# Patient Record
Sex: Female | Born: 1935 | Race: Black or African American | Hispanic: No | Smoking: Former smoker
Health system: Southern US, Community
[De-identification: ages and names within clinical notes are randomized; demographics above are authoritative.]

## PROBLEM LIST (undated history)

## (undated) DIAGNOSIS — B379 Candidiasis, unspecified: Secondary | ICD-10-CM

## (undated) DIAGNOSIS — Z8742 Personal history of other diseases of the female genital tract: Secondary | ICD-10-CM

## (undated) DIAGNOSIS — I10 Essential (primary) hypertension: Secondary | ICD-10-CM

## (undated) DIAGNOSIS — J439 Emphysema, unspecified: Secondary | ICD-10-CM

## (undated) DIAGNOSIS — IMO0002 Reserved for concepts with insufficient information to code with codable children: Secondary | ICD-10-CM

## (undated) DIAGNOSIS — Z8619 Personal history of other infectious and parasitic diseases: Secondary | ICD-10-CM

## (undated) DIAGNOSIS — K922 Gastrointestinal hemorrhage, unspecified: Secondary | ICD-10-CM

## (undated) DIAGNOSIS — E041 Nontoxic single thyroid nodule: Secondary | ICD-10-CM

## (undated) DIAGNOSIS — N8189 Other female genital prolapse: Secondary | ICD-10-CM

## (undated) DIAGNOSIS — Z6791 Unspecified blood type, Rh negative: Secondary | ICD-10-CM

## (undated) DIAGNOSIS — Z8709 Personal history of other diseases of the respiratory system: Secondary | ICD-10-CM

## (undated) HISTORY — DX: Candidiasis, unspecified: B37.9

## (undated) HISTORY — DX: Personal history of other diseases of the respiratory system: Z87.09

## (undated) HISTORY — DX: Unspecified blood type, rh negative: Z67.91

## (undated) HISTORY — DX: Essential (primary) hypertension: I10

## (undated) HISTORY — DX: Personal history of other infectious and parasitic diseases: Z86.19

## (undated) HISTORY — DX: Reserved for concepts with insufficient information to code with codable children: IMO0002

## (undated) HISTORY — DX: Other female genital prolapse: N81.89

## (undated) HISTORY — DX: Personal history of other diseases of the female genital tract: Z87.42

## (undated) HISTORY — DX: Emphysema, unspecified: J43.9

---

## 1997-08-02 ENCOUNTER — Other Ambulatory Visit: Admission: RE | Admit: 1997-08-02 | Discharge: 1997-08-02 | Payer: Self-pay | Admitting: Family Medicine

## 1997-08-06 ENCOUNTER — Other Ambulatory Visit: Admission: RE | Admit: 1997-08-06 | Discharge: 1997-08-06 | Payer: Self-pay | Admitting: Family Medicine

## 2002-03-07 ENCOUNTER — Ambulatory Visit (HOSPITAL_COMMUNITY): Admission: RE | Admit: 2002-03-07 | Discharge: 2002-03-07 | Payer: Self-pay | Admitting: Family Medicine

## 2004-08-31 ENCOUNTER — Encounter: Admission: RE | Admit: 2004-08-31 | Discharge: 2004-08-31 | Payer: Self-pay | Admitting: Internal Medicine

## 2005-02-05 ENCOUNTER — Emergency Department (HOSPITAL_COMMUNITY): Admission: EM | Admit: 2005-02-05 | Discharge: 2005-02-05 | Payer: Self-pay | Admitting: Emergency Medicine

## 2005-03-03 ENCOUNTER — Ambulatory Visit (HOSPITAL_COMMUNITY): Admission: RE | Admit: 2005-03-03 | Discharge: 2005-03-03 | Payer: Self-pay | Admitting: Family Medicine

## 2005-05-12 ENCOUNTER — Ambulatory Visit (HOSPITAL_COMMUNITY): Admission: RE | Admit: 2005-05-12 | Discharge: 2005-05-12 | Payer: Self-pay | Admitting: Gastroenterology

## 2005-05-12 ENCOUNTER — Encounter (INDEPENDENT_AMBULATORY_CARE_PROVIDER_SITE_OTHER): Payer: Self-pay | Admitting: Specialist

## 2009-01-23 DIAGNOSIS — I10 Essential (primary) hypertension: Secondary | ICD-10-CM

## 2009-01-23 DIAGNOSIS — Z8619 Personal history of other infectious and parasitic diseases: Secondary | ICD-10-CM

## 2009-01-23 DIAGNOSIS — N8189 Other female genital prolapse: Secondary | ICD-10-CM

## 2009-01-23 DIAGNOSIS — Z8742 Personal history of other diseases of the female genital tract: Secondary | ICD-10-CM

## 2009-01-23 DIAGNOSIS — Z6791 Unspecified blood type, Rh negative: Secondary | ICD-10-CM

## 2009-01-23 HISTORY — DX: Personal history of other infectious and parasitic diseases: Z86.19

## 2009-01-23 HISTORY — DX: Other female genital prolapse: N81.89

## 2009-01-23 HISTORY — DX: Personal history of other diseases of the female genital tract: Z87.42

## 2009-01-23 HISTORY — DX: Essential (primary) hypertension: I10

## 2009-01-23 HISTORY — DX: Unspecified blood type, rh negative: Z67.91

## 2009-02-13 DIAGNOSIS — IMO0002 Reserved for concepts with insufficient information to code with codable children: Secondary | ICD-10-CM

## 2009-02-13 HISTORY — DX: Reserved for concepts with insufficient information to code with codable children: IMO0002

## 2010-01-26 ENCOUNTER — Encounter: Admission: RE | Admit: 2010-01-26 | Discharge: 2010-01-26 | Payer: Self-pay | Admitting: Otolaryngology

## 2010-03-15 ENCOUNTER — Encounter: Payer: Self-pay | Admitting: Internal Medicine

## 2010-03-16 DIAGNOSIS — N8189 Other female genital prolapse: Secondary | ICD-10-CM | POA: Insufficient documentation

## 2010-07-10 NOTE — Op Note (Signed)
NAMESHANESE, RIEMENSCHNEIDER             ACCOUNT NO.:  [id number]   MEDICAL RECORD NO.:  [id number]          PATIENT TYPE:  AMB   LOCATION:  ENDO                         FACILITY:  [facility]   PHYSICIAN:  Anselmo Rod, M.D.  DATE OF BIRTH:  07/05/1925   DATE OF PROCEDURE:  05/12/2005  DATE OF DISCHARGE:                                 OPERATIVE REPORT   PROCEDURE PERFORMED:  Colonoscopy with cold biopsies x 6.   ENDOSCOPIST:  Anselmo Rod, M.D.   INSTRUMENT USED:  Olympus video colonoscope.   INDICATIONS FOR PROCEDURE:  A 77 year old African-American female with a  history of anemia and abnormal weight loss undergoing colonoscopy to rule  out colonic polyps, masses, etc.   PREPROCEDURE PREPARATION:  Informed consent was procured from the patient.  The patient was fasted for four hours prior to the procedure and prepped  with OsmoPrep pills the night of and the morning of the procedure.  The  risks and benefits of the procedure including a 10% miss rate for cancer or  polyps was discussed with the patient as well.   PREPROCEDURE PHYSICAL:  The patient had stable vital signs.  Neck supple.  Chest clear to auscultation.  S1 and S2 regular.  Abdomen soft with normal  bowel sounds.   DESCRIPTION OF PROCEDURE:  The patient was placed in left lateral decubitus  position and sedated with 80 mcg of fentanyl and 7.5 mg of Versed in slow  incremental doses.  Once the patient was adequately sedated and maintained  on low flow oxygen and continuous cardiac monitoring, the Olympus video  colonoscope was advanced from the rectum to the cecum.  The appendicular  orifice and ileocecal valve were clearly visualized and photographed.  There  was evidence of severe diverticular change throughout the colon with more  prominent changes in the left colon.  There was inspissated stool in several  of the diverticula.  A prominent fold was biopsied from 30 cm.  The  appendicular orifice and ileocecal valve  were visualized and photographed.  Small lesions could be missed.  The patient tolerated the procedure well  without immediate complication.   IMPRESSION:  1.  Pandiverticulosis with extensive changes in the left colon.  2.  Prominent fold biopsied from 30 cm.  3.  Large amount of residual stool in the colon.  Small lesions could be      missed.   RECOMMENDATIONS:  1.  Await pathology results.  2.  Avoid all nonsteroidals for now.  3.  Outpatient followup in the next two weeks for further recommendations.      Anselmo Rod, M.D.  Electronically Signed     JNM/MEDQ  D:  05/12/2005  T:  05/13/2005  Job:  [id number]   cc:   Candyce Churn. Allyne Gee, M.D.  Fax: [phone]

## 2010-10-15 ENCOUNTER — Ambulatory Visit: Payer: Self-pay | Admitting: Family Medicine

## 2010-11-01 ENCOUNTER — Emergency Department (HOSPITAL_COMMUNITY)
Admission: EM | Admit: 2010-11-01 | Discharge: 2010-11-01 | Disposition: A | Discharge status: Home / Self Care | Payer: Medicare Other | Attending: Emergency Medicine | Admitting: Emergency Medicine

## 2010-11-01 DIAGNOSIS — W268XXA Contact with other sharp object(s), not elsewhere classified, initial encounter: Secondary | ICD-10-CM | POA: Insufficient documentation

## 2010-11-01 DIAGNOSIS — I10 Essential (primary) hypertension: Secondary | ICD-10-CM | POA: Insufficient documentation

## 2010-11-01 DIAGNOSIS — S61209A Unspecified open wound of unspecified finger without damage to nail, initial encounter: Secondary | ICD-10-CM | POA: Insufficient documentation

## 2011-01-20 ENCOUNTER — Emergency Department (HOSPITAL_COMMUNITY)
Admission: EM | Admit: 2011-01-20 | Discharge: 2011-01-20 | Disposition: A | Discharge status: Home / Self Care | Payer: Medicare Other | Source: Home / Self Care | Attending: Emergency Medicine | Admitting: Emergency Medicine

## 2011-05-19 ENCOUNTER — Encounter (INDEPENDENT_AMBULATORY_CARE_PROVIDER_SITE_OTHER): Payer: Medicare Other | Admitting: Obstetrics and Gynecology

## 2011-05-19 DIAGNOSIS — N816 Rectocele: Secondary | ICD-10-CM

## 2011-05-19 DIAGNOSIS — N814 Uterovaginal prolapse, unspecified: Secondary | ICD-10-CM

## 2011-05-19 DIAGNOSIS — N8111 Cystocele, midline: Secondary | ICD-10-CM

## 2011-06-02 ENCOUNTER — Encounter: Payer: Medicare Other | Admitting: Obstetrics and Gynecology

## 2011-06-18 DIAGNOSIS — N8189 Other female genital prolapse: Secondary | ICD-10-CM

## 2011-06-21 ENCOUNTER — Ambulatory Visit (INDEPENDENT_AMBULATORY_CARE_PROVIDER_SITE_OTHER): Payer: Medicare Other | Admitting: Obstetrics and Gynecology

## 2011-06-21 ENCOUNTER — Encounter: Payer: Self-pay | Admitting: Obstetrics and Gynecology

## 2011-06-21 VITALS — BP 110/64 | HR 68 | Resp 16 | Ht <= 58 in | Wt 104.0 lb

## 2011-06-21 DIAGNOSIS — N8189 Other female genital prolapse: Secondary | ICD-10-CM

## 2011-06-21 NOTE — Progress Notes (Signed)
No complaints and wants Pessary replaced.  Filed Vitals:   06/21/11 1358  BP: 110/64  Pulse: 68  Resp: 16   Pelvic exam: normal external genitalia, vulva, vagina, cervix, uterus and adnexa.   Post fornix still slightly irritated. Silver Nitrate applied.  A/P Pelvic relaxation Pessary replaced On estrace and provera RTO in 2wks

## 2011-06-22 ENCOUNTER — Telehealth: Payer: Self-pay

## 2011-06-22 MED ORDER — MEDROXYPROGESTERONE ACETATE 10 MG PO TABS: 10.0000 mg | ORAL_TABLET | Freq: Every day | ORAL | Status: DC

## 2011-06-22 NOTE — Progress Notes (Signed)
Addended by: Osborn Coho on: 06/22/2011 03:24 PM   Modules accepted: Orders

## 2011-07-08 ENCOUNTER — Encounter: Payer: Self-pay | Admitting: Obstetrics and Gynecology

## 2011-07-08 ENCOUNTER — Ambulatory Visit (INDEPENDENT_AMBULATORY_CARE_PROVIDER_SITE_OTHER): Payer: Medicare Other | Admitting: Obstetrics and Gynecology

## 2011-07-08 VITALS — BP 106/62 | Ht <= 58 in | Wt 106.0 lb

## 2011-07-08 DIAGNOSIS — N8189 Other female genital prolapse: Secondary | ICD-10-CM

## 2011-07-08 NOTE — Progress Notes (Signed)
No complaints Filed Vitals:   07/08/11 1522  BP: 106/62   ROS: noncontributory  Pelvic exam:  VULVA: normal appearing vulva with no masses, tenderness or lesions,  VAGINA: normal appearing vagina with normal color and discharge, no lesions, CERVIX: normal appearing cervix without discharge or lesions,  UTERUS: uterus is normal size, shape, consistency and nontender,  ADNEXA: normal adnexa in size, nontender and no masses.  A/P Pessary changed without difficulty Cont vaginal estrogen and provera RTO 

## 2011-07-28 NOTE — Telephone Encounter (Signed)
Chart note to close encounter only. Shannon Riggs, [name]  

## 2011-09-15 ENCOUNTER — Encounter: Payer: Medicare Other | Admitting: Obstetrics and Gynecology

## 2011-09-28 ENCOUNTER — Ambulatory Visit (INDEPENDENT_AMBULATORY_CARE_PROVIDER_SITE_OTHER): Payer: Medicare Other | Admitting: Obstetrics and Gynecology

## 2011-09-28 ENCOUNTER — Encounter: Payer: Self-pay | Admitting: Obstetrics and Gynecology

## 2011-09-28 VITALS — BP 120/70 | Resp 14 | Wt 101.0 lb

## 2011-09-28 DIAGNOSIS — N939 Abnormal uterine and vaginal bleeding, unspecified: Secondary | ICD-10-CM

## 2011-09-28 DIAGNOSIS — N8189 Other female genital prolapse: Secondary | ICD-10-CM

## 2011-09-28 DIAGNOSIS — N898 Other specified noninflammatory disorders of vagina: Secondary | ICD-10-CM

## 2011-09-28 NOTE — Progress Notes (Signed)
C/o spotting  Filed Vitals:   09/28/11 1154  BP: 120/70  Resp: 14    ROS: noncontributory  Pelvic exam:  VULVA: normal appearing vulva with no masses, tenderness or lesions,  VAGINA: normal appearing vagina with normal color and discharge, no lesions, brown d/c in vagina, no areas of erosion CERVIX: normal appearing cervix without discharge or lesions,  UTERUS: uterus is normal size, shape, consistency and nontender,  ADNEXA: normal adnexa in size, nontender and no masses.  A/P D/c estrogen and provera Rx trimosan RTO 1 month for poss replacement of pessary and discuss with pt ordering smaller size

## 2011-10-28 ENCOUNTER — Encounter: Payer: Medicare Other | Admitting: Obstetrics and Gynecology

## 2011-11-01 ENCOUNTER — Other Ambulatory Visit: Payer: Self-pay | Admitting: Obstetrics and Gynecology

## 2011-11-23 ENCOUNTER — Other Ambulatory Visit: Payer: Self-pay | Admitting: Obstetrics and Gynecology

## 2011-11-25 ENCOUNTER — Other Ambulatory Visit: Payer: Self-pay

## 2011-11-25 ENCOUNTER — Telehealth: Payer: Self-pay | Admitting: Obstetrics and Gynecology

## 2011-11-25 DIAGNOSIS — E041 Nontoxic single thyroid nodule: Secondary | ICD-10-CM

## 2011-11-25 NOTE — Telephone Encounter (Signed)
VM from pt.  RF Rx.  Also when is appt.   Pt did not leave phone #. Possibly [phone]

## 2011-11-25 NOTE — Telephone Encounter (Signed)
Tc to pt per telephone call. Pt states,"never received rx from last office visit". Pt was suppose to have Trimosan pres. Will consult with provider. Pt agrees.

## 2011-11-26 MED ORDER — OXYQUINOLONE SULFATE 0.025 % VA GEL: VAGINAL | Status: DC

## 2011-11-26 NOTE — Telephone Encounter (Signed)
Consulted with AR, ok to fill Trimosan pv once weekly. Rx e-pres to pharm on file. Pt agrees.

## 2011-11-26 NOTE — Addendum Note (Signed)
Addended by: Lerry Liner D on: 11/26/2011 04:30 PM   Modules accepted: Orders

## 2011-12-27 ENCOUNTER — Other Ambulatory Visit: Payer: Self-pay | Admitting: Obstetrics and Gynecology

## 2012-10-05 ENCOUNTER — Encounter (HOSPITAL_COMMUNITY): Payer: Self-pay | Admitting: Emergency Medicine

## 2012-10-05 DIAGNOSIS — R5381 Other malaise: Secondary | ICD-10-CM | POA: Insufficient documentation

## 2012-10-05 DIAGNOSIS — Z8639 Personal history of other endocrine, nutritional and metabolic disease: Secondary | ICD-10-CM | POA: Insufficient documentation

## 2012-10-05 DIAGNOSIS — Z87891 Personal history of nicotine dependence: Secondary | ICD-10-CM | POA: Insufficient documentation

## 2012-10-05 DIAGNOSIS — R5383 Other fatigue: Secondary | ICD-10-CM | POA: Insufficient documentation

## 2012-10-05 DIAGNOSIS — Z8742 Personal history of other diseases of the female genital tract: Secondary | ICD-10-CM | POA: Insufficient documentation

## 2012-10-05 DIAGNOSIS — Z79899 Other long term (current) drug therapy: Secondary | ICD-10-CM | POA: Insufficient documentation

## 2012-10-05 DIAGNOSIS — Z862 Personal history of diseases of the blood and blood-forming organs and certain disorders involving the immune mechanism: Secondary | ICD-10-CM | POA: Insufficient documentation

## 2012-10-05 DIAGNOSIS — Z8619 Personal history of other infectious and parasitic diseases: Secondary | ICD-10-CM | POA: Insufficient documentation

## 2012-10-05 DIAGNOSIS — J438 Other emphysema: Secondary | ICD-10-CM | POA: Insufficient documentation

## 2012-10-05 DIAGNOSIS — I10 Essential (primary) hypertension: Secondary | ICD-10-CM | POA: Insufficient documentation

## 2012-10-05 NOTE — ED Notes (Signed)
FAMILY REPORTED THAT PT.'S BLOOD PRESSURE IS LOW THIS EVENING , SLIGHT HEADACHE , RESPIRATIONS UNLABORED . ALERT AND ORIENTED .

## 2012-10-05 NOTE — ED Notes (Signed)
NURSE FIRST ROUNDS : NURSE EXPLAINED DELAY , WAIT TIME AND PROCESS TO PT. RESPIRATIONS UNLABORED / DENIES PAIN AT THIS TIME .

## 2012-10-06 ENCOUNTER — Emergency Department (HOSPITAL_COMMUNITY): Payer: Medicare Other

## 2012-10-06 ENCOUNTER — Emergency Department (HOSPITAL_COMMUNITY)
Admission: EM | Admit: 2012-10-06 | Discharge: 2012-10-06 | Disposition: A | Discharge status: Home / Self Care | Payer: Medicare Other | Attending: Emergency Medicine | Admitting: Emergency Medicine

## 2012-10-06 DIAGNOSIS — R531 Weakness: Secondary | ICD-10-CM

## 2012-10-06 HISTORY — DX: Nontoxic single thyroid nodule: E04.1

## 2012-10-06 LAB — CBC WITH DIFFERENTIAL/PLATELET
Eosinophils Absolute: 0.6 10*3/uL (ref 0.0–0.7)
Hemoglobin: 10.9 g/dL — ABNORMAL LOW (ref 12.0–15.0)
Lymphocytes Relative: 29 % (ref 12–46)
Lymphs Abs: 2.7 10*3/uL (ref 0.7–4.0)
MCH: 26.7 pg (ref 26.0–34.0)
Monocytes Relative: 10 % (ref 3–12)
Neutrophils Relative %: 55 % (ref 43–77)
RBC: 4.08 MIL/uL (ref 3.87–5.11)
WBC: 9.5 10*3/uL (ref 4.0–10.5)

## 2012-10-06 LAB — URINE MICROSCOPIC-ADD ON

## 2012-10-06 LAB — COMPREHENSIVE METABOLIC PANEL
ALT: 10 U/L (ref 0–35)
Alkaline Phosphatase: 59 U/L (ref 39–117)
BUN: 18 mg/dL (ref 6–23)
CO2: 25 mEq/L (ref 19–32)
Chloride: 101 mEq/L (ref 96–112)
GFR calc Af Amer: 56 mL/min — ABNORMAL LOW (ref >90)
GFR calc non Af Amer: 48 mL/min — ABNORMAL LOW (ref >90)
Glucose, Bld: 101 mg/dL — ABNORMAL HIGH (ref 70–99)
Potassium: 3.6 mEq/L (ref 3.5–5.1)
Total Bilirubin: 0.3 mg/dL (ref 0.3–1.2)
Total Protein: 7.8 g/dL (ref 6.0–8.3)

## 2012-10-06 LAB — URINALYSIS, ROUTINE W REFLEX MICROSCOPIC
Bilirubin Urine: NEGATIVE
Glucose, UA: NEGATIVE mg/dL
Hgb urine dipstick: NEGATIVE
Ketones, ur: NEGATIVE mg/dL
Protein, ur: NEGATIVE mg/dL
Urobilinogen, UA: 1 mg/dL (ref 0.0–1.0)

## 2012-10-06 LAB — LIPASE, BLOOD: Lipase: 14 U/L (ref 11–59)

## 2012-10-06 MED ORDER — SODIUM CHLORIDE 0.9 % IV BOLUS (SEPSIS)
500.0000 mL | Freq: Once | INTRAVENOUS | Status: AC
  Administered 2012-10-06: 500 mL via INTRAVENOUS

## 2012-10-06 NOTE — ED Provider Notes (Signed)
CSN: [id number]     Arrival date & time 10/05/12  2158 History     First MD Initiated Contact with Patient 10/06/12 0202     Chief Complaint  Patient presents with  . Hypotension   (Consider location/radiation/quality/duration/timing/severity/associated sxs/prior Treatment) HPI Comments: Patient had her blood pressure checked by her daughters nurse and found it to be 98 systolic. She called her doctor and was referred to the ED. She checked her blood pressure because she was feeling "cold". She denies any dizziness, lightheadedness, chest pain or shortness of breath. No abdominal pain nausea or vomiting. She endorses good by mouth intake and urine output. Denies any focal weakness, numbness or tingling. She states she's been out of her blood pressure medications for the past 4 days and recently restarted them. Denies any changes with them. She states her normal blood pressure is 120 systolic. Her blood pressure is 120s to 130s and she denies complaints.  The history is provided by the patient.    Past Medical History  Diagnosis Date  . Blood type, Rh negative 01/23/09  . Yeast infection   . H/O emphysema   . Hypertension 01/23/09  . H/O measles 01/23/09  . Hx of varicella 01/23/09  . H/O uterine prolapse 01/23/09  . Pelvic relaxation 01/23/09  . Cystocele 02/13/09  . Thyroid cyst    History reviewed. No pertinent past surgical history. No family history on file. History  Substance Use Topics  . Smoking status: Former Smoker -- 0.00 packs/day    Types: Cigarettes    Quit date: 06/21/1978  . Smokeless tobacco: Not on file  . Alcohol Use: No   OB History   Grav Para Term Preterm Abortions TAB SAB Ect Mult Living                 Review of Systems  Constitutional: Negative for fever, activity change and appetite change.  HENT: Negative for congestion and rhinorrhea.   Respiratory: Negative for chest tightness and wheezing.   Cardiovascular: Negative for chest pain.   Gastrointestinal: Negative for nausea, vomiting and abdominal pain.  Genitourinary: Negative for dysuria and hematuria.  Musculoskeletal: Negative for back pain.  Neurological: Negative for dizziness, weakness and headaches.  A complete 10 system review of systems was obtained and all systems are negative except as noted in the HPI and PMH.    Allergies  Review of patient's allergies indicates no known allergies.  Home Medications   Current Outpatient Rx  Name  Route  Sig  Dispense  Refill  . amLODipine (NORVASC) 10 MG tablet   Oral   Take 10 mg by mouth daily.         . hydrochlorothiazide (HYDRODIURIL) 25 MG tablet   Oral   Take 25 mg by mouth daily.         Marland Kitchen HYDROcodone-acetaminophen (NORCO/VICODIN) 5-325 MG per tablet   Oral   Take 1 tablet by mouth every 6 (six) hours as needed for pain.         . mirtazapine (REMERON) 15 MG tablet   Oral   Take 15 mg by mouth at bedtime.         . naproxen (NAPROSYN) 500 MG tablet   Oral   Take 500 mg by mouth 2 (two) times daily as needed (for pain).         Marland Kitchen olmesartan-hydrochlorothiazide (BENICAR HCT) 40-25 MG per tablet   Oral   Take 1 tablet by mouth daily.         Marland Kitchen  simvastatin (ZOCOR) 10 MG tablet   Oral   Take 10 mg by mouth at bedtime.          BP 134/61  Pulse 72  Temp(Src) 99.5 F (37.5 C) (Oral)  Resp 13  SpO2 99% Physical Exam  Constitutional: She is oriented to person, place, and time. She appears well-developed and well-nourished. No distress.  HENT:  Head: Normocephalic and atraumatic.  Mouth/Throat: Oropharynx is clear and moist. No oropharyngeal exudate.  Slightly dry mucus membranes  Eyes: Conjunctivae and EOM are normal. Pupils are equal, round, and reactive to light.  Neck: Normal range of motion. Neck supple.  Cardiovascular: Normal rate, regular rhythm and normal heart sounds.   Pulmonary/Chest: Effort normal and breath sounds normal. No respiratory distress.  Abdominal: Soft.  There is no tenderness. There is no rebound and no guarding.  Musculoskeletal: Normal range of motion. She exhibits no edema and no tenderness.  Neurological: She is alert and oriented to person, place, and time. No cranial nerve deficit. She exhibits normal muscle tone. Coordination normal.  CN 2-12 intact, no ataxia on finger to nose, no nystagmus, 5/5 strength throughout, no pronator drift, Romberg negative, normal gait.   Skin: Skin is warm.    ED Course   Procedures (including critical care time)  Labs Reviewed  CBC WITH DIFFERENTIAL - Abnormal; Notable for the following:    Hemoglobin 10.9 (*)    HCT 34.0 (*)    Eosinophils Relative 7 (*)    All other components within normal limits  COMPREHENSIVE METABOLIC PANEL - Abnormal; Notable for the following:    Glucose, Bld 101 (*)    Albumin 3.2 (*)    GFR calc non Af Amer 48 (*)    GFR calc Af Amer 56 (*)    All other components within normal limits  URINALYSIS, ROUTINE W REFLEX MICROSCOPIC - Abnormal; Notable for the following:    APPearance CLOUDY (*)    Leukocytes, UA SMALL (*)    All other components within normal limits  URINE MICROSCOPIC-ADD ON - Abnormal; Notable for the following:    Casts HYALINE CASTS (*)    All other components within normal limits  URINE CULTURE  LIPASE, BLOOD  TROPONIN I   Dg Chest 2 View  10/06/2012   *RADIOLOGY REPORT*  Clinical Data: Weakness.  CHEST - 2 VIEW  Comparison: Chest x-ray 01/26/2010.  Findings: Large hiatal hernia. Lung volumes are normal.  No consolidative airspace disease.  No pleural effusions.  No pneumothorax.  No pulmonary nodule or mass noted.  Pulmonary vasculature and the cardiomediastinal silhouette are within normal limits.  Atherosclerosis in the thoracic aorta.  IMPRESSION: 1. No radiographic evidence of acute cardiopulmonary disease. 2.  Atherosclerosis. 3.  Large hiatal hernia.   Original Report Authenticated By: Trudie Reed, M.D.   1. Weakness     MDM   Transient hypotension with blood pressure checked at home. Blood pressure normal here. Asymptomatic.  Patient appears well. Her exam is unremarkable. Orthostatics are negative. Given her age, labs and x-ray will be obtained.  Labs show mild anemia without comparison. Urinalysis is equivocal and culture will be sent. Chest x-ray negative. Patient ambulatory without assistance. Tolerating by mouth. Blood pressure has been stable throughout the entire ED course. No hypotension or tachycardia.    Date: 10/06/2012  Rate: 83  Rhythm: normal sinus rhythm  QRS Axis: normal  Intervals: normal  ST/T Wave abnormalities: normal  Conduction Disutrbances:none  Narrative Interpretation:   Old EKG Reviewed: unchanged  BP 134/61  Pulse 72  Temp(Src) 99.5 F (37.5 C) (Oral)  Resp 13  SpO2 99%   Glynn Octave, MD 10/06/12 [phone]

## 2012-10-06 NOTE — ED Notes (Signed)
Pt is unable to give urine specimen at this time. Pt will call tech, when able to give u/a specimen. The tech has reported to the RN in charge.

## 2012-10-06 NOTE — ED Notes (Signed)
Fluid challenge completed, patient tolerated well.

## 2012-10-07 LAB — URINE CULTURE

## 2012-10-24 ENCOUNTER — Encounter (HOSPITAL_COMMUNITY): Payer: Self-pay | Admitting: Emergency Medicine

## 2012-10-24 ENCOUNTER — Inpatient Hospital Stay (HOSPITAL_COMMUNITY)
Admission: EM | Admit: 2012-10-24 | Discharge: 2012-10-27 | DRG: 377 | Disposition: A | Discharge status: Home / Self Care | Payer: Medicare Other | Attending: Family Medicine | Admitting: Family Medicine

## 2012-10-24 ENCOUNTER — Emergency Department (HOSPITAL_COMMUNITY): Payer: Medicare Other

## 2012-10-24 DIAGNOSIS — K922 Gastrointestinal hemorrhage, unspecified: Principal | ICD-10-CM

## 2012-10-24 DIAGNOSIS — R55 Syncope and collapse: Secondary | ICD-10-CM

## 2012-10-24 DIAGNOSIS — I10 Essential (primary) hypertension: Secondary | ICD-10-CM

## 2012-10-24 DIAGNOSIS — I129 Hypertensive chronic kidney disease with stage 1 through stage 4 chronic kidney disease, or unspecified chronic kidney disease: Secondary | ICD-10-CM | POA: Diagnosis present

## 2012-10-24 DIAGNOSIS — K269 Duodenal ulcer, unspecified as acute or chronic, without hemorrhage or perforation: Secondary | ICD-10-CM | POA: Diagnosis present

## 2012-10-24 DIAGNOSIS — N8189 Other female genital prolapse: Secondary | ICD-10-CM

## 2012-10-24 DIAGNOSIS — E860 Dehydration: Secondary | ICD-10-CM

## 2012-10-24 DIAGNOSIS — R82998 Other abnormal findings in urine: Secondary | ICD-10-CM | POA: Diagnosis present

## 2012-10-24 DIAGNOSIS — I951 Orthostatic hypotension: Secondary | ICD-10-CM

## 2012-10-24 DIAGNOSIS — K449 Diaphragmatic hernia without obstruction or gangrene: Secondary | ICD-10-CM | POA: Diagnosis present

## 2012-10-24 DIAGNOSIS — N179 Acute kidney failure, unspecified: Secondary | ICD-10-CM

## 2012-10-24 DIAGNOSIS — N183 Chronic kidney disease, stage 3 unspecified: Secondary | ICD-10-CM

## 2012-10-24 DIAGNOSIS — R8290 Unspecified abnormal findings in urine: Secondary | ICD-10-CM

## 2012-10-24 DIAGNOSIS — I959 Hypotension, unspecified: Secondary | ICD-10-CM

## 2012-10-24 DIAGNOSIS — Z79899 Other long term (current) drug therapy: Secondary | ICD-10-CM

## 2012-10-24 DIAGNOSIS — E43 Unspecified severe protein-calorie malnutrition: Secondary | ICD-10-CM

## 2012-10-24 DIAGNOSIS — IMO0002 Reserved for concepts with insufficient information to code with codable children: Secondary | ICD-10-CM

## 2012-10-24 DIAGNOSIS — D62 Acute posthemorrhagic anemia: Secondary | ICD-10-CM | POA: Diagnosis present

## 2012-10-24 DIAGNOSIS — Z6791 Unspecified blood type, Rh negative: Secondary | ICD-10-CM

## 2012-10-24 LAB — COMPREHENSIVE METABOLIC PANEL
ALT: 13 U/L (ref 0–35)
AST: 20 U/L (ref 0–37)
Albumin: 2.2 g/dL — ABNORMAL LOW (ref 3.5–5.2)
Alkaline Phosphatase: 46 U/L (ref 39–117)
CO2: 21 mEq/L (ref 19–32)
Chloride: 108 mEq/L (ref 96–112)
GFR calc non Af Amer: 32 mL/min — ABNORMAL LOW (ref >90)
Potassium: 4.3 mEq/L (ref 3.5–5.1)
Sodium: 141 mEq/L (ref 135–145)
Total Bilirubin: 0.2 mg/dL — ABNORMAL LOW (ref 0.3–1.2)

## 2012-10-24 LAB — POCT I-STAT TROPONIN I: Troponin i, poc: 0.01 ng/mL (ref 0.00–0.08)

## 2012-10-24 LAB — OCCULT BLOOD, POC DEVICE: Fecal Occult Bld: POSITIVE — AB

## 2012-10-24 LAB — CBC WITH DIFFERENTIAL/PLATELET
Basophils Absolute: 0 10*3/uL (ref 0.0–0.1)
Basophils Relative: 0 % (ref 0–1)
HCT: 25.7 % — ABNORMAL LOW (ref 36.0–46.0)
Lymphocytes Relative: 19 % (ref 12–46)
MCHC: 32.3 g/dL (ref 30.0–36.0)
Monocytes Absolute: 1.2 10*3/uL — ABNORMAL HIGH (ref 0.1–1.0)
Neutro Abs: 4.9 10*3/uL (ref 1.7–7.7)
Neutrophils Relative %: 60 % (ref 43–77)
Platelets: 266 10*3/uL (ref 150–400)
RDW: 14.7 % (ref 11.5–15.5)
WBC: 8.1 10*3/uL (ref 4.0–10.5)

## 2012-10-24 LAB — PREPARE RBC (CROSSMATCH)

## 2012-10-24 LAB — CARBOXYHEMOGLOBIN: O2 Saturation: 22.2 %

## 2012-10-24 MED ORDER — SODIUM CHLORIDE 0.9 % IV SOLN
80.0000 mg | INTRAVENOUS | Status: AC
  Administered 2012-10-24: 20:00:00 80 mg via INTRAVENOUS
  Filled 2012-10-24: qty 80

## 2012-10-24 MED ORDER — SODIUM CHLORIDE 0.9 % IV SOLN
INTRAVENOUS | Status: DC
  Administered 2012-10-24 – 2012-10-26 (×2): via INTRAVENOUS

## 2012-10-24 MED ORDER — SODIUM CHLORIDE 0.9 % IV BOLUS (SEPSIS)
1000.0000 mL | Freq: Once | INTRAVENOUS | Status: AC
  Administered 2012-10-24: 1000 mL via INTRAVENOUS

## 2012-10-24 MED ORDER — ONDANSETRON HCL 4 MG/2ML IJ SOLN: 4.0000 mg | Freq: Three times a day (TID) | INTRAMUSCULAR | Status: AC | PRN

## 2012-10-24 MED ORDER — HYDROCODONE-ACETAMINOPHEN 5-325 MG PO TABS
1.0000 | ORAL_TABLET | Freq: Four times a day (QID) | ORAL | Status: DC | PRN
  Administered 2012-10-26: 1 via ORAL
  Filled 2012-10-24: qty 1

## 2012-10-24 MED ORDER — SODIUM CHLORIDE 0.9 % IV SOLN
80.0000 mg | Freq: Two times a day (BID) | INTRAVENOUS | Status: DC
  Administered 2012-10-25 – 2012-10-26 (×4): 80 mg via INTRAVENOUS
  Filled 2012-10-24 (×9): qty 80

## 2012-10-24 MED ORDER — SODIUM CHLORIDE 0.9 % IV SOLN: INTRAVENOUS | Status: DC

## 2012-10-24 MED ORDER — SODIUM CHLORIDE 0.9 % IV BOLUS (SEPSIS)
500.0000 mL | Freq: Once | INTRAVENOUS | Status: AC
  Administered 2012-10-24: 500 mL via INTRAVENOUS

## 2012-10-24 MED ORDER — SODIUM CHLORIDE 0.9 % IJ SOLN
3.0000 mL | Freq: Two times a day (BID) | INTRAMUSCULAR | Status: DC
  Administered 2012-10-25: 3 mL via INTRAVENOUS

## 2012-10-24 MED ORDER — SIMVASTATIN 10 MG PO TABS
10.0000 mg | ORAL_TABLET | Freq: Every day | ORAL | Status: DC
  Administered 2012-10-25 – 2012-10-26 (×2): 10 mg via ORAL
  Filled 2012-10-24 (×4): qty 1

## 2012-10-24 NOTE — Consult Note (Signed)
Reason for Consult: Heme positive stool and anemia Referring Physician: [facility]  Broadus John HPI: This is an 79 year old female with multiple medical problems admitted for syncope.  During her work up she was noted to be hypotensive with an SBP in the 60's range.  With IV fluids her blood pressure increased to the 120 range.  She was previously in the ER on 8/15 with similar complaints of feeling weak, but the work up was negative.  During the interval time period her HGB dropped from the 10 to the 8 range.  She had a colonoscopy by Dr. Loreta Ave on 05/12/2005 with findings of diverticula.  She does complain of epigastric pain and she has noticed some black formed stool within the past several weeks.  Past Medical History  Diagnosis Date  . Blood type, Rh negative 01/23/09  . Yeast infection   . H/O emphysema   . Hypertension 01/23/09  . H/O measles 01/23/09  . Hx of varicella 01/23/09  . H/O uterine prolapse 01/23/09  . Pelvic relaxation 01/23/09  . Cystocele 02/13/09  . Thyroid cyst     History reviewed. No pertinent past surgical history.  No family history on file.  Social History:  reports that she quit smoking about 34 years ago. Her smoking use included Cigarettes. She smoked 0.00 packs per day. She does not have any smokeless tobacco history on file. She reports that she does not drink alcohol or use illicit drugs.  Allergies: No Known Allergies  Medications:  Scheduled: . sodium chloride   Intravenous STAT  . [START ON 10/25/2012] pantoprazole (PROTONIX) IV  80 mg Intravenous Q12H  . [START ON 10/25/2012] simvastatin  10 mg Oral QHS  . sodium chloride  3 mL Intravenous Q12H   Continuous: . sodium chloride 100 mL/hr at 10/24/12 2112    Results for orders placed during the hospital encounter of 10/24/12 (from the past 24 hour(s))  CBC WITH DIFFERENTIAL     Status: Abnormal   Collection Time    10/24/12  5:45 PM      Result Value Range   WBC 8.1  4.0 - 10.5 K/uL   RBC 3.13 (*) 3.87 - 5.11 MIL/uL   Hemoglobin 8.3 (*) 12.0 - 15.0 g/dL   HCT 40.9 (*) 81.1 - 91.4 %   MCV 82.1  78.0 - 100.0 fL   MCH 26.5  26.0 - 34.0 pg   MCHC 32.3  30.0 - 36.0 g/dL   RDW 78.2  95.6 - 21.3 %   Platelets 266  150 - 400 K/uL   Neutrophils Relative % 60  43 - 77 %   Neutro Abs 4.9  1.7 - 7.7 K/uL   Lymphocytes Relative 19  12 - 46 %   Lymphs Abs 1.6  0.7 - 4.0 K/uL   Monocytes Relative 15 (*) 3 - 12 %   Monocytes Absolute 1.2 (*) 0.1 - 1.0 K/uL   Eosinophils Relative 6 (*) 0 - 5 %   Eosinophils Absolute 0.5  0.0 - 0.7 K/uL   Basophils Relative 0  0 - 1 %   Basophils Absolute 0.0  0.0 - 0.1 K/uL  COMPREHENSIVE METABOLIC PANEL     Status: Abnormal   Collection Time    10/24/12  5:45 PM      Result Value Range   Sodium 141  135 - 145 mEq/L   Potassium 4.3  3.5 - 5.1 mEq/L   Chloride 108  96 - 112 mEq/L  CO2 21  19 - 32 mEq/L   Glucose, Bld 90  70 - 99 mg/dL   BUN 31 (*) 6 - 23 mg/dL   Creatinine, Ser 8.11 (*) 0.50 - 1.10 mg/dL   Calcium 8.9  8.4 - 91.4 mg/dL   Total Protein 6.0  6.0 - 8.3 g/dL   Albumin 2.2 (*) 3.5 - 5.2 g/dL   AST 20  0 - 37 U/L   ALT 13  0 - 35 U/L   Alkaline Phosphatase 46  39 - 117 U/L   Total Bilirubin 0.2 (*) 0.3 - 1.2 mg/dL   GFR calc non Af Amer 32 (*) >90 mL/min   GFR calc Af Amer 37 (*) >90 mL/min  CARBOXYHEMOGLOBIN     Status: Abnormal   Collection Time    10/24/12  6:00 PM      Result Value Range   Total hemoglobin 8.4 (*) 12.0 - 16.0 g/dL   O2 Saturation 78.2     Carboxyhemoglobin 0.9  0.5 - 1.5 %   Methemoglobin 0.7  0.0 - 1.5 %  OCCULT BLOOD, POC DEVICE     Status: Abnormal   Collection Time    10/24/12  6:10 PM      Result Value Range   Fecal Occult Bld POSITIVE (*) NEGATIVE  POCT I-STAT TROPONIN I     Status: None   Collection Time    10/24/12  6:25 PM      Result Value Range   Troponin i, poc 0.01  0.00 - 0.08 ng/mL   Comment 3              Dg Chest 2 View  10/24/2012   *RADIOLOGY REPORT*  Clinical Data: Loss  of consciousness.  CHEST - 2 VIEW  Comparison: 10/06/2012  Findings: COPD with hyperinflation of the lungs.  Negative for heart failure or pneumonia.  Large hiatal hernia containing air fluid level.  IMPRESSION: COPD.  No acute cardiopulmonary abnormality.  Large hiatal hernia with air-fluid level.   Original Report Authenticated By: Janeece Riggers, M.D.    ROS:  As stated above in the HPI otherwise negative.  Blood pressure 125/60, pulse 77, temperature 97.8 F (36.6 C), temperature source Oral, resp. rate 20, SpO2 100.00%.    PE: Gen: NAD, Alert and Oriented HEENT:  [place]/AT, EOMI Neck: Supple, no LAD Lungs: CTA Bilaterally CV: RRR without M/G/R ABM: Soft, epigastric tenderness, +BS Ext: No C/C/E  Assessment/Plan: 1) Anemia. 2) Heme positive stool. 3) Epigastric abdominal pain.  Plan: 1) EGD tomorrow.  Gerad Cornelio D 10/24/2012, 9:44 PM

## 2012-10-24 NOTE — ED Notes (Signed)
Per pt's grandaughter pt had just gotten back from a funeral and was sitting outside on the front porch, she stepped away and when she came back pt was unresponsive. Pt did not fall she was in a chair when episode happened. Unsure of the amt of time that pt was unresponsive. EMS reports pt has been alert and oriented en route, denies any pain but does state she has had some increased weakness. Pt was hypotensive en route, 70's systolic was given 400 ml NS and pressure came up to 91/71. Pt was admitted here about 2wks ago for hypotension. 20g LFA.

## 2012-10-24 NOTE — ED Provider Notes (Signed)
CSN: [id number]     Arrival date & time 10/24/12  1711 History   First MD Initiated Contact with Patient 10/24/12 1714     Chief Complaint  Patient presents with  . Loss of Consciousness   (Consider location/radiation/quality/duration/timing/severity/associated sxs/prior Treatment) HPI Comments: 79 yo female with htn hx and hx of hypotension episode presents after syncope.  The family had funeral for her daughter today and while sitting she slumped forward and for seconds was unresponsive.  No shaking or focal sxs.  No recent bleeding.  Pt feels general weakness with decr appetite for 1 wk and epig discomfort, non radiating, mild.  No vomiting. Mild nausea.  Nothing improves. No head injury.    Patient is a 79 y.o. female presenting with syncope. The history is provided by the patient and a relative.  Loss of Consciousness Episode history:  Single Associated symptoms: no chest pain, no fever, no headaches, no shortness of breath and no vomiting     Past Medical History  Diagnosis Date  . Blood type, Rh negative 01/23/09  . Yeast infection   . H/O emphysema   . Hypertension 01/23/09  . H/O measles 01/23/09  . Hx of varicella 01/23/09  . H/O uterine prolapse 01/23/09  . Pelvic relaxation 01/23/09  . Cystocele 02/13/09  . Thyroid cyst    History reviewed. No pertinent past surgical history. No family history on file. History  Substance Use Topics  . Smoking status: Former Smoker -- 0.00 packs/day    Types: Cigarettes    Quit date: 06/21/1978  . Smokeless tobacco: Not on file  . Alcohol Use: No   OB History   Grav Para Term Preterm Abortions TAB SAB Ect Mult Living                 Review of Systems  Constitutional: Positive for fatigue. Negative for fever and chills.  HENT: Negative for neck pain and neck stiffness.   Eyes: Negative for visual disturbance.  Respiratory: Negative for shortness of breath.   Cardiovascular: Positive for syncope. Negative for chest pain.   Gastrointestinal: Positive for abdominal pain. Negative for vomiting.  Genitourinary: Positive for dysuria. Negative for flank pain.  Musculoskeletal: Negative for back pain.  Skin: Negative for rash.  Neurological: Positive for syncope and light-headedness. Negative for headaches.    Allergies  Review of patient's allergies indicates no known allergies.  Home Medications   Current Outpatient Rx  Name  Route  Sig  Dispense  Refill  . amLODipine (NORVASC) 10 MG tablet   Oral   Take 10 mg by mouth daily.         . hydrochlorothiazide (HYDRODIURIL) 25 MG tablet   Oral   Take 25 mg by mouth daily.         Marland Kitchen HYDROcodone-acetaminophen (NORCO/VICODIN) 5-325 MG per tablet   Oral   Take 1 tablet by mouth every 6 (six) hours as needed for pain. For pain         . mirtazapine (REMERON) 15 MG tablet   Oral   Take 15 mg by mouth at bedtime.         . simvastatin (ZOCOR) 10 MG tablet   Oral   Take 10 mg by mouth at bedtime.          BP 61/41  Pulse 71  Temp(Src) 97.5 F (36.4 C) (Oral)  Resp 20  SpO2 97% Physical Exam  Nursing note and vitals reviewed. Constitutional: She is oriented to person, place, and  time. She appears well-developed. No distress.  HENT:  Head: Normocephalic and atraumatic.  Dry mm  Eyes: Conjunctivae are normal. Right eye exhibits no discharge. Left eye exhibits no discharge.  Neck: Normal range of motion. Neck supple. No tracheal deviation present.  Cardiovascular: Normal rate and regular rhythm.   No murmur heard. Pulmonary/Chest: Effort normal and breath sounds normal.  Abdominal: Soft. She exhibits no distension. There is tenderness (mild epig). There is no guarding.  Genitourinary: Guaiac positive stool.  Musculoskeletal: She exhibits no edema.  Neurological: She is alert and oriented to person, place, and time.  Skin: Skin is warm. No rash noted. There is pallor.  Psychiatric: She has a normal mood and affect.    ED Course   Procedures (including critical care time)   EMERGENCY DEPARTMENT [place] CARDIAC EXAM "Study: Limited Ultrasound of the heart and pericardium"  INDICATIONS:Hypotension Multiple views of the heart and pericardium were obtained in real-time with a multi-frequency probe.  PERFORMED ZO:XWRUEA  IMAGES ARCHIVED?: Yes  FINDINGS: No pericardial effusion, Normal contractility and IVC normal  LIMITATIONS:  none  VIEWS USED: Parasternal long axis, Parasternal short axis, Apical 4 chamber  and Inferior Vena Cava  INTERPRETATION: Cardiac activity present, Pericardial effusioin absent, Cardiac tamponade absent, Probable low CVP and Normal contractility  EMERGENCY DEPARTMENT ULTRASOUND  Study: Limited Retroperitoneal Ultrasound of the Abdominal Aorta.  INDICATIONS:Hypotension, Abdominal pain and Age>55 Multiple views of the abdominal aorta were obtained in real-time from the diaphragmatic hiatus to the aortic bifurcation in transverse planes with a multi-frequency probe. PERFORMED BY: Myself IMAGES ARCHIVED?: Yes FINDINGS: Maximum aortic dimensions are 1.4 cm LIMITATIONS:  Bowel gas INTERPRETATION:  No abdominal aortic aneurysm  CRITICAL CARE Performed by: Enid Skeens   Total critical care time: 35 min  Critical care time was exclusive of separately billable procedures and treating other patients.  Critical care was necessary to treat or prevent imminent or life-threatening deterioration.  Critical care was time spent personally by me on the following activities: development of treatment plan with patient and/or surrogate as well as nursing, discussions with consultants, evaluation of patient's response to treatment, examination of patient, obtaining history from patient or surrogate, ordering and performing treatments and interventions, ordering and review of laboratory studies, ordering and review of radiographic studies, pulse oximetry and re-evaluation of patient's condition.  Labs  Review Labs Reviewed  CBC WITH DIFFERENTIAL - Abnormal; Notable for the following:    RBC 3.13 (*)    Hemoglobin 8.3 (*)    HCT 25.7 (*)    Monocytes Relative 15 (*)    Monocytes Absolute 1.2 (*)    Eosinophils Relative 6 (*)    All other components within normal limits  COMPREHENSIVE METABOLIC PANEL - Abnormal; Notable for the following:    BUN 31 (*)    Creatinine, Ser 1.46 (*)    Albumin 2.2 (*)    Total Bilirubin 0.2 (*)    GFR calc non Af Amer 32 (*)    GFR calc Af Amer 37 (*)    All other components within normal limits  CARBOXYHEMOGLOBIN - Abnormal; Notable for the following:    Total hemoglobin 8.4 (*)    All other components within normal limits  OCCULT BLOOD, POC DEVICE - Abnormal; Notable for the following:    Fecal Occult Bld POSITIVE (*)    All other components within normal limits  URINALYSIS, ROUTINE W REFLEX MICROSCOPIC  OCCULT BLOOD X 1 CARD TO LAB, STOOL  POCT I-STAT TROPONIN I  TYPE  AND SCREEN   Imaging Review No results found.  MDM  No diagnosis found. Syncope with hypotension 60 systolic.   With recent abd pain [place] brought in to rule out AAA, good EF on cardiac [place], IVC collapsed 50% with resp.  Hemocult sent.  Fluid bolus and IVs. Concern for dehydration/ metabolic vs sepsis vs other.  No free fluid in abdomen.  Paged hospitalist and GI One unit PRBC ordered. Repeat H and H. Recheck, pt improved, bp improved.  PPI given.  2 fluid boluses.  Type and screen . Pt and bp improved on recheck.  Spoke with GI and hospitalist, step down admission.  Hemocult pos, acute blood loss anemia, dehydration, ARF, GI bleed  Enid Skeens, MD 10/24/12 2041

## 2012-10-24 NOTE — ED Notes (Signed)
Pt unable to void at this time. 

## 2012-10-24 NOTE — H&P (Signed)
[facility] History and Physical  Shannon Riggs XBM:[id number] DOB: [birth year] DOA: 10/24/2012  Referring physician: ED PCP: Gwynneth Aliment, MD   Chief Complaint: Syncope, GI bleed  HPI: Shannon Riggs is a 79 y.o. female who presents to the ED after an episode of syncope occuring earlier this afternoon.  She had just been to her daughter's funeral but even prior to this has been feeling weak for a number of weeks.  She was evaluated in the ED on 10/06/12 for her weakness most recently.  Because of the syncopal episode the patient presented to the ED.  In the ED she was initially hypotensive with BP 61/41, this improved after IVF bolus, work up was positive for anemia with HGB of 8.3 (10.9 on 8/15), and guiac positive stool.  GI was consulted and hospitalist asked to admit for GI bleed.  Review of Systems: No vomiting, does have nausea, decreased appetitie for 1 week, no BRBPR, 12 systems reviewed and otherwise negative.  Past Medical History  Diagnosis Date  . Blood type, Rh negative 01/23/09  . Yeast infection   . H/O emphysema   . Hypertension 01/23/09  . H/O measles 01/23/09  . Hx of varicella 01/23/09  . H/O uterine prolapse 01/23/09  . Pelvic relaxation 01/23/09  . Cystocele 02/13/09  . Thyroid cyst    History reviewed. No pertinent past surgical history. Social History:  reports that she quit smoking about 34 years ago. Her smoking use included Cigarettes. She smoked 0.00 packs per day. She does not have any smokeless tobacco history on file. She reports that she does not drink alcohol or use illicit drugs.   No Known Allergies  No family history on file.   Prior to Admission medications   Medication Sig Start Date End Date Taking? Authorizing Provider  amLODipine (NORVASC) 10 MG tablet Take 10 mg by mouth daily.   Yes Historical Provider, MD  hydrochlorothiazide (HYDRODIURIL) 25 MG tablet Take 25 mg by mouth daily.   Yes Historical Provider, MD   HYDROcodone-acetaminophen (NORCO/VICODIN) 5-325 MG per tablet Take 1 tablet by mouth every 6 (six) hours as needed for pain. For pain   Yes Historical Provider, MD  mirtazapine (REMERON) 15 MG tablet Take 15 mg by mouth at bedtime.   Yes Historical Provider, MD  simvastatin (ZOCOR) 10 MG tablet Take 10 mg by mouth at bedtime.   Yes Historical Provider, MD   Physical Exam: Filed Vitals:   10/24/12 1945  BP: 80/40  Pulse: 76  Temp:   Resp: 17    General:  NAD, resting comfortably in bed Eyes: PEERLA EOMI ENT: mucous membranes moist Neck: supple w/o JVD Cardiovascular: RRR w/o MRG Respiratory: CTA B Abdomen: soft, mild epigastric tenderness, nd, bs+ Skin: no rash nor lesion Musculoskeletal: MAE, full ROM all 4 extremities Psychiatric: normal tone and affect Neurologic: AAOx3, grossly non-focal  Labs on Admission:  Basic Metabolic Panel:  Recent Labs Lab 10/24/12 1745  NA 141  K 4.3  CL 108  CO2 21  GLUCOSE 90  BUN 31*  CREATININE 1.46*  CALCIUM 8.9   Liver Function Tests:  Recent Labs Lab 10/24/12 1745  AST 20  ALT 13  ALKPHOS 46  BILITOT 0.2*  PROT 6.0  ALBUMIN 2.2*   No results found for this basename: LIPASE, AMYLASE,  in the last 168 hours No results found for this basename: AMMONIA,  in the last 168 hours CBC:  Recent Labs Lab 10/24/12 1745  WBC 8.1  NEUTROABS 4.9  HGB 8.3*  HCT 25.7*  MCV 82.1  PLT 266   Cardiac Enzymes: No results found for this basename: CKTOTAL, CKMB, CKMBINDEX, TROPONINI,  in the last 168 hours  BNP (last 3 results) No results found for this basename: PROBNP,  in the last 8760 hours CBG: No results found for this basename: GLUCAP,  in the last 168 hours  Radiological Exams on Admission: Dg Chest 2 View  10/24/2012   *RADIOLOGY REPORT*  Clinical Data: Loss of consciousness.  CHEST - 2 VIEW  Comparison: 10/06/2012  Findings: COPD with hyperinflation of the lungs.  Negative for heart failure or pneumonia.  Large hiatal  hernia containing air fluid level.  IMPRESSION: COPD.  No acute cardiopulmonary abnormality.  Large hiatal hernia with air-fluid level.   Original Report Authenticated By: Janeece Riggers, M.D.    EKG: Independently reviewed.  Assessment/Plan Principal Problem:   GI bleed Active Problems:   Acute blood loss anemia   AKI (acute kidney injury)   HTN (hypertension)   1. GI bleed with acute blood loss anemia- GI consulted, plan to see patient tomorrow, IVF for hypotension, tele monitor, repeat Hgb ordered for midnight, type and screen ordered, suspect patient may very well require transfusion after repeat Hgb comes back.  Likely EGD tomorrow and possibly colonoscopy the next day as discussed with family. 2. AKI - almost certainly pre-renal given her hypotension today, intake and output ordered, IVF ordered. 3. HTN - hypotensive due to #1, holding BP meds for now.    Code Status: Full (must indicate code status--if unknown or must be presumed, indicate so) Family Communication: Spoke with family at bedside (indicate person spoken with, if applicable, with phone number if by telephone) Disposition Plan: Admit to inpatient (indicate anticipated LOS)  Time spent: 70 min  GARDNER, JARED M. [facility] Pager [phone]  If 7PM-7AM, please contact night-coverage [email or web address] Password [email or web address] 10/24/2012, 8:20 PM

## 2012-10-25 ENCOUNTER — Encounter (HOSPITAL_COMMUNITY): Payer: Self-pay | Admitting: *Deleted

## 2012-10-25 ENCOUNTER — Encounter (HOSPITAL_COMMUNITY)
Admission: EM | Disposition: A | Discharge status: Home / Self Care | Payer: Self-pay | Source: Home / Self Care | Attending: Family Medicine

## 2012-10-25 DIAGNOSIS — E43 Unspecified severe protein-calorie malnutrition: Secondary | ICD-10-CM | POA: Insufficient documentation

## 2012-10-25 DIAGNOSIS — R8290 Unspecified abnormal findings in urine: Secondary | ICD-10-CM | POA: Diagnosis not present

## 2012-10-25 DIAGNOSIS — I951 Orthostatic hypotension: Secondary | ICD-10-CM | POA: Diagnosis present

## 2012-10-25 DIAGNOSIS — R55 Syncope and collapse: Secondary | ICD-10-CM | POA: Diagnosis present

## 2012-10-25 HISTORY — PX: ESOPHAGOGASTRODUODENOSCOPY: SHX5428

## 2012-10-25 LAB — URINALYSIS, ROUTINE W REFLEX MICROSCOPIC
Glucose, UA: NEGATIVE mg/dL
Hgb urine dipstick: NEGATIVE
Specific Gravity, Urine: 1.009 (ref 1.005–1.030)

## 2012-10-25 LAB — CBC
Hemoglobin: 8.8 g/dL — ABNORMAL LOW (ref 12.0–15.0)
MCH: 26.8 pg (ref 26.0–34.0)
MCHC: 32.7 g/dL (ref 30.0–36.0)
Platelets: 296 10*3/uL (ref 150–400)
RDW: 14.8 % (ref 11.5–15.5)

## 2012-10-25 LAB — BASIC METABOLIC PANEL
BUN: 22 mg/dL (ref 6–23)
CO2: 20 mEq/L (ref 19–32)
Chloride: 112 mEq/L (ref 96–112)
Creatinine, Ser: 1.06 mg/dL (ref 0.50–1.10)

## 2012-10-25 LAB — IRON AND TIBC: Saturation Ratios: 22 % (ref 20–55)

## 2012-10-25 LAB — MRSA PCR SCREENING: MRSA by PCR: NEGATIVE

## 2012-10-25 LAB — VITAMIN B12: Vitamin B-12: 1052 pg/mL — ABNORMAL HIGH (ref 211–911)

## 2012-10-25 LAB — RETICULOCYTES
RBC.: 3.62 MIL/uL — ABNORMAL LOW (ref 3.87–5.11)
Retic Ct Pct: 1.2 % (ref 0.4–3.1)

## 2012-10-25 LAB — TSH: TSH: 0.476 u[IU]/mL (ref 0.350–4.500)

## 2012-10-25 SURGERY — EGD (ESOPHAGOGASTRODUODENOSCOPY)
Anesthesia: Moderate Sedation

## 2012-10-25 MED ORDER — SODIUM CHLORIDE 0.9 % IV SOLN: INTRAVENOUS | Status: DC

## 2012-10-25 MED ORDER — MIDAZOLAM HCL 5 MG/ML IJ SOLN
INTRAMUSCULAR | Status: AC
  Filled 2012-10-25: qty 1

## 2012-10-25 MED ORDER — LIDOCAINE VISCOUS 2 % MT SOLN
OROMUCOSAL | Status: DC | PRN
  Administered 2012-10-25: 16:00:00 20 mL via OROMUCOSAL

## 2012-10-25 MED ORDER — LIDOCAINE VISCOUS 2 % MT SOLN
OROMUCOSAL | Status: AC
  Filled 2012-10-25: qty 15

## 2012-10-25 MED ORDER — MIDAZOLAM HCL 10 MG/2ML IJ SOLN
INTRAMUSCULAR | Status: DC | PRN
  Administered 2012-10-25 (×2): 2 mg via INTRAVENOUS

## 2012-10-25 MED ORDER — DEXTROSE 5 % IV SOLN
1.0000 g | INTRAVENOUS | Status: DC
  Administered 2012-10-25: 1 g via INTRAVENOUS
  Filled 2012-10-25: qty 10

## 2012-10-25 MED ORDER — FENTANYL CITRATE 0.05 MG/ML IJ SOLN
INTRAMUSCULAR | Status: DC | PRN
  Administered 2012-10-25: 25 ug via INTRAVENOUS
  Administered 2012-10-25: 15 ug via INTRAVENOUS

## 2012-10-25 MED ORDER — FENTANYL CITRATE 0.05 MG/ML IJ SOLN
INTRAMUSCULAR | Status: AC
  Filled 2012-10-25: qty 2

## 2012-10-25 NOTE — Progress Notes (Signed)
Utilization review completed.  

## 2012-10-25 NOTE — Op Note (Signed)
[facility] [address] [place], [address]   OPERATIVE PROCEDURE REPORT  PATIENT :Shannon Riggs, Shannon Riggs.  MR#: [id number] BIRTHDATE :1925/06/28 GENDER: Female ENDOSCOPIST: Dr.  Lorenza Burton, MD ASSISTANT:   Kandice Robinsons, technician Jeneen Montgomery, RN PROCEDURE DATE: 11-05-2012 PRE-PROCEDURE PREPERATION: Patient fasted for 4 hours prior to procedure. PRE-PROCEDURE PHYSICAL: Patient has stable vital signs.  Neck is supple.  There is no JVD, thyromegaly or LAD.  Chest clear to auscultation.  S1 and S2 regular.  Abdomen soft, non-distended, non-tender with NABS. PROCEDURE:     EGD with antral biopsies x 2. ASA CLASS:     Class III INDICATIONS:     Epigastric pain, acute post hemorrhagic anemia, guaiac poistive stool. MEDICATIONS:     Fentanyl  40 mcg and Versed 4 mg IV. TOPICAL ANESTHETIC:   Viscous xylocaine-15 cc PO.  DESCRIPTION OF PROCEDURE:   After the risks benefits and alternatives of the procedure were thoroughly explained, informed consent was obtained.  The Pentax Gastroscope [id number]  was introduced through the mouth and advanced to the second portion of the duodenum , without limitations. The instrument was slowly withdrawn as the mucosa was fully examined.   The esophagus and GEJ appeared normal. The stomach appeared normal. There was a small ulcer in the duodenal bulb and a 2 cm ulcer in the postbulbar region with a clean base; there was no visible vessel noted. Antral biopsies were done to rule out H,. pylori by pathology. The small bowel distal to the ulcers appeared normal upto 60 cm. There were no masses or polyps noted. Retroflexed views revealed a 3-4 cm hiatl hernia. The scope was then withdrawn from the patient and the procedure terminated. The patient tolerated the procedure without immediate complications.  IMPRESSION:  1) Hiatal hernia 3-4 cm noted on retroflexion. 2) Small, clean based ulcer in the duodenal bulb. 3) Large 2  cm ulcer with a clean base in the postbulbar region. 4)  Otherwise, normal esophagogastroduodenoscopy. 5) Antral biopsies done to rule out H. pylori by pathology.  RECOMMENDATIONS:     1.  Anti-reflux regimen to be followed. 2.  Avoid all NSAIDS for now. 3.  Await pathology results. 4.  Continue current medications.   REPEAT EXAM:  No recall planned for now.  DISCHARGE INSTRUCTIONS: standard instructions given.  _______________________________ eSigned:  Dr. Lorenza Burton, MD Nov 05, 2012 4:21 PM   CPT CODES:     13244, EGD with biopsies.  DIAGNOSIS CODES:     789.06, 280.9, 792.1, 553.3   CC:  PATIENT NAME:  Shannon Riggs, Shannon Riggs. MR#: [id number]

## 2012-10-25 NOTE — Progress Notes (Signed)
Pt transferred from 3 south. Alert and oriented x4, skin intact. IV in LFA and RFA NS @ 100 in left forearm. No pain, NPO for EGD at 2:30pm. Non tele. Skin intact. Will continue to monitor. Driggers, [employer]

## 2012-10-25 NOTE — Progress Notes (Signed)
[facility] Progress Note [place] TEAM 1 - Stepdown ICU Team   AZKA STEGER AOZ:[id number] DOB: 1925/03/18 DOA: 10/24/2012 PCP: Gwynneth Aliment, MD  Brief narrative: 79 year old female patient sent to the emergency department after experiencing syncope. Endorsed several weeks prior to this event she had been feeling weak. She had recently been evaluated in the emergency department on 10/06/2012 because of the weakness. At that time her hemoglobin was 10.9. She was transiently hypotensive but her orthostatic vital signs were normal. Her urinalysis was equivocal and she was discharged. A urine culture was obtained but was not available at time of discharge. On current visit to the emergency department at presentation she was hypotensive with a BP of 61/41 and her hemoglobin had dropped to 8.3. Her stool was Hemoccult positive. Gastroenterology was consulted by the emergency room physician. Her blood pressure did improve somewhat after IV fluid boluses.  Assessment/Plan:    Syncope and collapse/Orthostatic hypotension -multifactorial: DH and symptomatic anemia -better after hydration and no focal neuro signs so doubt TIA -note was on diuretic pre admit    UGI bleed -FOB positive -for EGD 9/3 -GI following    Acute blood loss anemia -hgb has been stable since admit -follow serially -seems more c/w chronic blood loss so will check an anemia panel and TSH    AKI on CKD stage 3, GFR 30-59 ml/min -GFR stable    HTN (hypertension) -BP remains soft so hold home meds    Abnormal urinalysis -cx from ER visit 8/15 with 35,000 colonies multiple morphotypes - repeat this visit    DVT prophylaxis: SCDs Code Status: Full Family Communication: No family at bedside Disposition Plan/Expected LOS: Transfer to floor  Consultants: Gastroenterology  Procedures: EGD pending  Antibiotics: None  HPI/Subjective: Patient alert. Denies chest pain or shortness of breath. No apparent  issues with melena or hematochezia since admit, though dose attest to some melena intermittently prior to admit.  Objective: Blood pressure 136/61, pulse 82, temperature 97.8 F (36.6 C), temperature source Oral, resp. rate 20, height 4\' 11"  (1.499 m), weight 42.7 kg (94 lb 2.2 oz), SpO2 99.00%.  Intake/Output Summary (Last 24 hours) at 10/25/12 1318 Last data filed at 10/25/12 1156  Gross per 24 hour  Intake      3 ml  Output    300 ml  Net   -297 ml   Exam: General: No acute respiratory distress Lungs: Clear to auscultation bilaterally without wheezes or crackles, RA Cardiovascular: Regular rate and rhythm without murmur gallop or rub normal S1 and S2, no peripheral edema or JVD Abdomen: Nontender, nondistended, soft, bowel sounds positive, no rebound, no ascites, no appreciable mass Musculoskeletal: No significant cyanosis, clubbing of bilateral lower extremities Neurological: Alert, moves all extremities x 4 without focal neurological deficits, CN 2-12 intact  Scheduled Meds:  Scheduled Meds: . sodium chloride   Intravenous STAT  . cefTRIAXone (ROCEPHIN)  IV  1 g Intravenous Q24H  . pantoprazole (PROTONIX) IV  80 mg Intravenous Q12H  . simvastatin  10 mg Oral QHS  . sodium chloride  3 mL Intravenous Q12H   Data Reviewed: Basic Metabolic Panel:  Recent Labs Lab 10/24/12 1745 10/25/12 0400  NA 141 142  K 4.3 3.8  CL 108 112  CO2 21 20  GLUCOSE 90 79  BUN 31* 22  CREATININE 1.46* 1.06  CALCIUM 8.9 8.9   Liver Function Tests:  Recent Labs Lab 10/24/12 1745  AST 20  ALT 13  ALKPHOS 46  BILITOT 0.2*  PROT 6.0  ALBUMIN 2.2*   No results found for this basename: LIPASE, AMYLASE,  in the last 168 hours No results found for this basename: AMMONIA,  in the last 168 hours CBC:  Recent Labs Lab 10/24/12 1745 10/24/12 2302 10/25/12 0400  WBC 8.1  --  8.5  NEUTROABS 4.9  --   --   HGB 8.3* 9.2* 8.8*  HCT 25.7* 27.7* 26.7*  MCV 82.1  --  81.4  PLT 266  --   289    Recent Results (from the past 240 hour(s))  MRSA PCR SCREENING     Status: None   Collection Time    10/25/12  2:24 AM      Result Value Range Status   MRSA by PCR NEGATIVE  NEGATIVE Final   Comment:            The GeneXpert MRSA Assay (FDA     approved for NASAL specimens     only), is one component of a     comprehensive MRSA colonization     surveillance program. It is not     intended to diagnose MRSA     infection nor to guide or     monitor treatment for     MRSA infections.     Studies:  Recent x-ray studies have been reviewed in detail by the Attending Physician  Junious Silk, ANP [facility] Office  [phone] Pager [phone]  **If unable to reach the above provider after paging please contact the Flow Manager @ [phone]  On-Call/Text Page:      [email or web address]      password [email or web address]  If 7PM-7AM, please contact night-coverage [email or web address] Password [email or web address] 10/25/2012, 1:18 PM   LOS: 1 day   I have personally examined this patient and reviewed the entire database. I have reviewed the above note, made any necessary editorial changes, and agree with its content.  Lonia Blood, MD [facility]

## 2012-10-25 NOTE — Progress Notes (Signed)
INITIAL NUTRITION ASSESSMENT  DOCUMENTATION CODES Per approved criteria  -Severe malnutrition in the context of chronic illness   INTERVENTION:  Diet advancement as medically able post procedure. Recommend regular diet with Ensure Complete PO TID, each supplement provides 350 kcal and 13 grams of protein.  NUTRITION DIAGNOSIS: Malnutrition related to inadequate oral intake as evidenced by severe muscle loss and severe subcutaneous fat loss with 11% weight loss in the past 2 months.   Goal: Intake to meet >90% of estimated nutrition needs.  Monitor:  Diet advancement, PO intake, labs, weight trend.  Reason for Assessment: MST=4  80 y.o. female  Admitting Dx: GI bleed  ASSESSMENT: Patient admitted with syncope. Found to have GIB. Plans for EGD today. Patient remains NPO at this time. Patient c/o poor appetite for the past 2 months with associated poor intake and weight loss. She likes Ensure, drinks it at home sometimes.   Nutrition Focused Physical Exam:  Subcutaneous Fat:  Orbital Region: mild-moderate depletion Upper Arm Region: severe depletion Thoracic and Lumbar Region: mild-moderate depletion  Muscle:  Temple Region: mild-moderate depletion Clavicle Bone Region: severe depletion Clavicle and Acromion Bone Region: severe depletion Scapular Bone Region: mild-moderate depletion Dorsal Hand: severe depletion Patellar Region: severe depletion Anterior Thigh Region: mild-moderate depletion Posterior Calf Region: mild-moderate depletion  Edema: none  Pt meets criteria for severe MALNUTRITION in the context of chronic illness as evidenced by severe muscle and subcutaneous loss with 11% weight loss in the past 2 months.  Height: Ht Readings from Last 1 Encounters:  10/24/12 4\' 11"  (1.499 m)    Weight: Wt Readings from Last 1 Encounters:  10/24/12 94 lb 2.2 oz (42.7 kg)    Ideal Body Weight: 44.5 kg  % Ideal Body Weight: 96%  Wt Readings from Last 10  Encounters:  10/24/12 94 lb 2.2 oz (42.7 kg)  10/24/12 94 lb 2.2 oz (42.7 kg)  09/28/11 101 lb (45.813 kg)  07/08/11 106 lb (48.081 kg)  06/21/11 104 lb (47.174 kg)    Usual Body Weight: 106 lb 2 months ago per patient.  % Usual Body Weight: 89%  BMI:  Body mass index is 19 kg/(m^2).  Estimated Nutritional Needs: Kcal: 1300-1500 Protein: 70-80 gm Fluid: 1.3-1.5 L  Skin: no problems  Diet Order: NPO  EDUCATION NEEDS: -Education not appropriate at this time   Intake/Output Summary (Last 24 hours) at 10/25/12 1158 Last data filed at 10/25/12 1156  Gross per 24 hour  Intake      3 ml  Output    300 ml  Net   -297 ml    Last BM: none documented   Labs:   Recent Labs Lab 10/24/12 1745 10/25/12 0400  NA 141 142  K 4.3 3.8  CL 108 112  CO2 21 20  BUN 31* 22  CREATININE 1.46* 1.06  CALCIUM 8.9 8.9  GLUCOSE 90 79    CBG (last 3)  No results found for this basename: GLUCAP,  in the last 72 hours  Scheduled Meds: . sodium chloride   Intravenous STAT  . cefTRIAXone (ROCEPHIN)  IV  1 g Intravenous Q24H  . pantoprazole (PROTONIX) IV  80 mg Intravenous Q12H  . simvastatin  10 mg Oral QHS  . sodium chloride  3 mL Intravenous Q12H    Continuous Infusions: . sodium chloride 100 mL/hr at 10/25/12 1102  . sodium chloride      Past Medical History  Diagnosis Date  . Blood type, Rh negative 01/23/09  . Yeast  infection   . H/O emphysema   . Hypertension 01/23/09  . H/O measles 01/23/09  . Hx of varicella 01/23/09  . H/O uterine prolapse 01/23/09  . Pelvic relaxation 01/23/09  . Cystocele 02/13/09  . Thyroid cyst     History reviewed. No pertinent past surgical history.  Joaquin Courts, RD, LDN, CNSC Pager [phone] After Hours Pager [phone]

## 2012-10-25 NOTE — Progress Notes (Signed)
Patient being transferred to [address] [place] per MD order, Report called to [place], [place], patient will transfer in wheel chair, all vital signs WNL. Patient fully alert and oriented.

## 2012-10-26 ENCOUNTER — Encounter (HOSPITAL_COMMUNITY): Payer: Self-pay | Admitting: Gastroenterology

## 2012-10-26 DIAGNOSIS — E860 Dehydration: Secondary | ICD-10-CM

## 2012-10-26 DIAGNOSIS — I10 Essential (primary) hypertension: Secondary | ICD-10-CM

## 2012-10-26 LAB — TYPE AND SCREEN
Antibody Screen: NEGATIVE
Unit division: 0

## 2012-10-26 LAB — BASIC METABOLIC PANEL
Chloride: 108 mEq/L (ref 96–112)
Creatinine, Ser: 0.9 mg/dL (ref 0.50–1.10)
GFR calc Af Amer: 66 mL/min — ABNORMAL LOW (ref >90)

## 2012-10-26 LAB — CBC
MCV: 82.8 fL (ref 78.0–100.0)
Platelets: 317 10*3/uL (ref 150–400)
RDW: 15.1 % (ref 11.5–15.5)
WBC: 9.3 10*3/uL (ref 4.0–10.5)
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
## 2012-10-27 DIAGNOSIS — R55 Syncope and collapse: Secondary | ICD-10-CM

## 2012-10-27 LAB — BASIC METABOLIC PANEL
BUN: 7 mg/dL (ref 6–23)
Chloride: 108 mEq/L (ref 96–112)
Glucose, Bld: 76 mg/dL (ref 70–99)
Potassium: 3.6 mEq/L (ref 3.5–5.1)

## 2012-10-27 LAB — URINE CULTURE: Colony Count: NO GROWTH

## 2012-10-27 LAB — CBC
HCT: 27.3 % — ABNORMAL LOW (ref 36.0–46.0)
Hemoglobin: 8.9 g/dL — ABNORMAL LOW (ref 12.0–15.0)
WBC: 7.1 10*3/uL (ref 4.0–10.5)

## 2012-10-27 MED ORDER — HYDROCODONE-ACETAMINOPHEN 5-325 MG PO TABS: 1.0000 | ORAL_TABLET | Freq: Four times a day (QID) | ORAL | Status: DC | PRN

## 2012-10-27 MED ORDER — FERROUS SULFATE 325 (65 FE) MG PO TABS: 325.0000 mg | ORAL_TABLET | Freq: Every day | ORAL | Status: AC

## 2012-10-27 MED ORDER — PANTOPRAZOLE SODIUM 40 MG PO TBEC: 40.0000 mg | DELAYED_RELEASE_TABLET | Freq: Every day | ORAL | Status: DC

## 2012-10-27 NOTE — Progress Notes (Signed)
Shannon Riggs to be D/C'd Home per MD order.  Discussed with the patient and all questions fully answered.    Medication List    STOP taking these medications       amLODipine 10 MG tablet  Commonly known as:  NORVASC     hydrochlorothiazide 25 MG tablet  Commonly known as:  HYDRODIURIL      TAKE these medications       ferrous sulfate 325 (65 FE) MG tablet  Take 1 tablet (325 mg total) by mouth daily with breakfast.     HYDROcodone-acetaminophen 5-325 MG per tablet  Commonly known as:  NORCO/VICODIN  Take 1 tablet by mouth every 6 (six) hours as needed for pain. For pain     mirtazapine 15 MG tablet  Commonly known as:  REMERON  Take 15 mg by mouth at bedtime.     pantoprazole 40 MG tablet  Commonly known as:  PROTONIX  Take 1 tablet (40 mg total) by mouth daily.     simvastatin 10 MG tablet  Commonly known as:  ZOCOR  Take 10 mg by mouth at bedtime.        VVS, Skin clean, dry and intact without evidence of skin break down, no evidence of skin tears noted. IV catheter discontinued intact. Site without signs and symptoms of complications. Dressing and pressure applied.  An After Visit Summary was printed and given to the patient. Follow up appointments , new prescriptions and medication administration times given. Education and handouts given to pt and son in law on anemia, syncope, orthostatic hypotension, GI bleeding. All questions answered and showed understanding. Pt to stop BP meds until MD visit in 5 days. Patient escorted via WC, and D/C home via private auto.  Cindra Eves, RN 10/27/2012 11:52 AM
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
## 2012-10-27 NOTE — Care Management Note (Signed)
    Page 1 of 1   10/27/2012     11:49:34 AM   CARE MANAGEMENT NOTE 10/27/2012  Patient:  Shannon Riggs, Shannon Riggs   Account Number:  [id number]  Date Initiated:  10/25/2012  Documentation initiated by:  Donn Pierini  Subjective/Objective Assessment:   Pt admitted with syncope and GIB     Action/Plan:   PTA pt lived at home with family- NCM to follow for d/c needs- for EGD today   Anticipated DC Date:  10/27/2012   Anticipated DC Plan:  HOME/SELF CARE      DC Planning Services  CM consult      Choice offered to / List presented to:             Status of service:  Completed, signed off Medicare Important Message given?   (If response is "NO", the following Medicare IM given date fields will be blank) Date Medicare IM given:   Date Additional Medicare IM given:    Discharge Disposition:  HOME/SELF CARE  Per UR Regulation:  Reviewed for med. necessity/level of care/duration of stay  If discussed at Long Length of Stay Meetings, dates discussed:    Comments:  10/27/12 11:48 Letha Cape RN, BSN [phone] patient dc to home, per physical therapy patient has no pt needs or dme needs.

## 2013-03-23 ENCOUNTER — Encounter (HOSPITAL_COMMUNITY): Payer: Self-pay | Admitting: Emergency Medicine

## 2013-03-23 ENCOUNTER — Emergency Department (HOSPITAL_COMMUNITY)
Admission: EM | Admit: 2013-03-23 | Discharge: 2013-03-24 | Disposition: A | Discharge status: Home / Self Care | Payer: Medicare FFS | Attending: Emergency Medicine | Admitting: Emergency Medicine

## 2013-03-23 DIAGNOSIS — Z8619 Personal history of other infectious and parasitic diseases: Secondary | ICD-10-CM | POA: Insufficient documentation

## 2013-03-23 DIAGNOSIS — R109 Unspecified abdominal pain: Secondary | ICD-10-CM

## 2013-03-23 DIAGNOSIS — Z8639 Personal history of other endocrine, nutritional and metabolic disease: Secondary | ICD-10-CM | POA: Insufficient documentation

## 2013-03-23 DIAGNOSIS — Z8742 Personal history of other diseases of the female genital tract: Secondary | ICD-10-CM | POA: Insufficient documentation

## 2013-03-23 DIAGNOSIS — Z862 Personal history of diseases of the blood and blood-forming organs and certain disorders involving the immune mechanism: Secondary | ICD-10-CM | POA: Insufficient documentation

## 2013-03-23 DIAGNOSIS — J438 Other emphysema: Secondary | ICD-10-CM | POA: Insufficient documentation

## 2013-03-23 DIAGNOSIS — K449 Diaphragmatic hernia without obstruction or gangrene: Secondary | ICD-10-CM | POA: Insufficient documentation

## 2013-03-23 DIAGNOSIS — Z87891 Personal history of nicotine dependence: Secondary | ICD-10-CM | POA: Insufficient documentation

## 2013-03-23 DIAGNOSIS — K8020 Calculus of gallbladder without cholecystitis without obstruction: Secondary | ICD-10-CM | POA: Insufficient documentation

## 2013-03-23 DIAGNOSIS — Z9889 Other specified postprocedural states: Secondary | ICD-10-CM | POA: Insufficient documentation

## 2013-03-23 DIAGNOSIS — Z79899 Other long term (current) drug therapy: Secondary | ICD-10-CM | POA: Insufficient documentation

## 2013-03-23 DIAGNOSIS — I10 Essential (primary) hypertension: Secondary | ICD-10-CM | POA: Insufficient documentation

## 2013-03-23 HISTORY — DX: Gastrointestinal hemorrhage, unspecified: K92.2

## 2013-03-23 LAB — URINALYSIS, ROUTINE W REFLEX MICROSCOPIC
Bilirubin Urine: NEGATIVE
GLUCOSE, UA: NEGATIVE mg/dL
Hgb urine dipstick: NEGATIVE
KETONES UR: NEGATIVE mg/dL
Nitrite: NEGATIVE
PROTEIN: NEGATIVE mg/dL
Specific Gravity, Urine: 1.012 (ref 1.005–1.030)
UROBILINOGEN UA: 0.2 mg/dL (ref 0.0–1.0)
pH: 5 (ref 5.0–8.0)

## 2013-03-23 LAB — CBC WITH DIFFERENTIAL/PLATELET
BASOS ABS: 0 10*3/uL (ref 0.0–0.1)
BASOS PCT: 0 % (ref 0–1)
EOS PCT: 5 % (ref 0–5)
Eosinophils Absolute: 0.6 10*3/uL (ref 0.0–0.7)
HEMATOCRIT: 36.3 % (ref 36.0–46.0)
Hemoglobin: 12.1 g/dL (ref 12.0–15.0)
Lymphocytes Relative: 22 % (ref 12–46)
Lymphs Abs: 2.3 10*3/uL (ref 0.7–4.0)
MCH: 27.9 pg (ref 26.0–34.0)
MCHC: 33.3 g/dL (ref 30.0–36.0)
MCV: 83.8 fL (ref 78.0–100.0)
MONO ABS: 1.1 10*3/uL — AB (ref 0.1–1.0)
Monocytes Relative: 10 % (ref 3–12)
NEUTROS ABS: 6.7 10*3/uL (ref 1.7–7.7)
Neutrophils Relative %: 63 % (ref 43–77)
PLATELETS: 241 10*3/uL (ref 150–400)
RBC: 4.33 MIL/uL (ref 3.87–5.11)
RDW: 15.7 % — AB (ref 11.5–15.5)
WBC: 10.6 10*3/uL — AB (ref 4.0–10.5)

## 2013-03-23 LAB — BASIC METABOLIC PANEL
BUN: 14 mg/dL (ref 6–23)
CALCIUM: 10 mg/dL (ref 8.4–10.5)
CO2: 20 mEq/L (ref 19–32)
CREATININE: 0.79 mg/dL (ref 0.50–1.10)
Chloride: 102 mEq/L (ref 96–112)
GFR calc non Af Amer: 73 mL/min — ABNORMAL LOW (ref >90)
GFR, EST AFRICAN AMERICAN: 85 mL/min — AB (ref >90)
Glucose, Bld: 120 mg/dL — ABNORMAL HIGH (ref 70–99)
Potassium: 4.2 mEq/L (ref 3.7–5.3)
SODIUM: 140 meq/L (ref 137–147)

## 2013-03-23 LAB — URINE MICROSCOPIC-ADD ON

## 2013-03-23 MED ORDER — IOHEXOL 300 MG/ML  SOLN
25.0000 mL | INTRAMUSCULAR | Status: AC
  Administered 2013-03-23: 25 mL via ORAL

## 2013-03-23 NOTE — ED Notes (Signed)
Pt presents to the department with c/o lower abdominal pain, pt states she has a hx of dropped bladder six months ago and is worried this may have happened again. Pt is A&O X4. EMS reports the pt is HTN and tachycardic. Pt has a hx of HTN. Pt denies N/V/D.

## 2013-03-23 NOTE — ED Notes (Signed)
Pt's family reports pt was actually seen in the hospital last summer for a GI bleed, pt states she had a bladder procedure also performed prior to the GI bleed, pt informed she has ulcers in her stomach.

## 2013-03-24 ENCOUNTER — Encounter (HOSPITAL_COMMUNITY): Payer: Self-pay | Admitting: Radiology

## 2013-03-24 ENCOUNTER — Emergency Department (HOSPITAL_COMMUNITY): Payer: Medicare FFS

## 2013-03-24 MED ORDER — IOHEXOL 300 MG/ML  SOLN
80.0000 mL | Freq: Once | INTRAMUSCULAR | Status: AC | PRN
  Administered 2013-03-24: 80 mL via INTRAVENOUS

## 2013-03-24 MED ORDER — ONDANSETRON HCL 4 MG/2ML IJ SOLN
4.0000 mg | Freq: Once | INTRAMUSCULAR | Status: AC
  Administered 2013-03-24: 4 mg via INTRAVENOUS
  Filled 2013-03-24: qty 2

## 2013-03-24 MED ORDER — ONDANSETRON 8 MG PO TBDP: 8.0000 mg | ORAL_TABLET | Freq: Three times a day (TID) | ORAL | Status: DC | PRN

## 2013-03-24 NOTE — ED Provider Notes (Signed)
CSN: [id number]     Arrival date & time 03/23/13  2015 History   First MD Initiated Contact with Patient 03/23/13 2046     Chief Complaint  Patient presents with  . Abdominal Pain   (Consider location/radiation/quality/duration/timing/severity/associated sxs/prior Treatment) The history is provided by the patient.   patient reports nausea to her most today without vomiting.  She reports generalized diffuse abdominal pain hours she reports majority tenderness in her lower abdomen.  No urinary complaints.  No back pain or flank pain.  No fevers or chills.  No recent sick contacts.  She states the pain has been crampy through the day.  Past Medical History  Diagnosis Date  . Blood type, Rh negative 01/23/09  . Yeast infection   . H/O emphysema   . Hypertension 01/23/09  . H/O measles 01/23/09  . Hx of varicella 01/23/09  . H/O uterine prolapse 01/23/09  . Pelvic relaxation 01/23/09  . Cystocele 02/13/09  . Thyroid cyst   . GI bleed    Past Surgical History  Procedure Laterality Date  . Esophagogastroduodenoscopy N/A 10/25/2012    Procedure: ESOPHAGOGASTRODUODENOSCOPY (EGD);  Surgeon: Charna ElizabethJyothi Mann, MD;  [facility];  Service: Endoscopy;  Laterality: N/A;   No family history on file. History  Substance Use Topics  . Smoking status: Former Smoker -- 0.00 packs/day    Types: Cigarettes    Quit date: 06/21/1978  . Smokeless tobacco: Not on file  . Alcohol Use: No   OB History   Grav Para Term Preterm Abortions TAB SAB Ect Mult Living                 Review of Systems  Gastrointestinal: Positive for abdominal pain.  All other systems reviewed and are negative.    Allergies  Review of patient's allergies indicates no known allergies.  Home Medications   Current Outpatient Rx  Name  Route  Sig  Dispense  Refill  . ferrous sulfate 325 (65 FE) MG tablet   Oral   Take 1 tablet (325 mg total) by mouth daily with breakfast.   14 tablet   0   .  HYDROcodone-acetaminophen (NORCO/VICODIN) 5-325 MG per tablet   Oral   Take 1 tablet by mouth every 6 (six) hours as needed for pain. For pain   10 tablet   0   . mirtazapine (REMERON) 15 MG tablet   Oral   Take 15 mg by mouth at bedtime.         . pantoprazole (PROTONIX) 40 MG tablet   Oral   Take 1 tablet (40 mg total) by mouth daily.   30 tablet   1   . simvastatin (ZOCOR) 10 MG tablet   Oral   Take 10 mg by mouth at bedtime.         . ondansetron (ZOFRAN ODT) 8 MG disintegrating tablet   Oral   Take 1 tablet (8 mg total) by mouth every 8 (eight) hours as needed for nausea or vomiting.   10 tablet   0    BP 170/79  Pulse 109  Temp(Src) 97.2 F (36.2 C) (Oral)  Resp 14  SpO2 100% Physical Exam  Nursing note and vitals reviewed. Constitutional: She is oriented to person, place, and time. She appears well-developed and well-nourished. No distress.  HENT:  Head: Normocephalic and atraumatic.  Eyes: EOM are normal.  Neck: Normal range of motion.  Cardiovascular: Normal rate, regular rhythm and normal heart sounds.   Pulmonary/Chest: Effort  normal and breath sounds normal.  Abdominal: Soft. She exhibits no distension.  Mild generalized abdominal tenderness without guarding or rebound  Musculoskeletal: Normal range of motion.  Neurological: She is alert and oriented to person, place, and time.  Skin: Skin is warm and dry.  Psychiatric: She has a normal mood and affect. Judgment normal.    ED Course  Procedures (including critical care time) Labs Review Labs Reviewed  URINALYSIS, ROUTINE W REFLEX MICROSCOPIC - Abnormal; Notable for the following:    Leukocytes, UA TRACE (*)    All other components within normal limits  CBC WITH DIFFERENTIAL - Abnormal; Notable for the following:    WBC 10.6 (*)    RDW 15.7 (*)    Monocytes Absolute 1.1 (*)    All other components within normal limits  BASIC METABOLIC PANEL - Abnormal; Notable for the following:     Glucose, Bld 120 (*)    GFR calc non Af Amer 73 (*)    GFR calc Af Amer 85 (*)    All other components within normal limits  URINE MICROSCOPIC-ADD ON   Imaging Review Ct Abdomen Pelvis W Contrast  03/24/2013   CLINICAL DATA:  Lower abdominal pain, nausea and vomiting  EXAM: CT ABDOMEN AND PELVIS WITH CONTRAST  TECHNIQUE: Multidetector CT imaging of the abdomen and pelvis was performed using the standard protocol following bolus administration of intravenous contrast.  CONTRAST:  80mL OMNIPAQUE IOHEXOL 300 MG/ML  SOLN  COMPARISON:  None.  FINDINGS: Lung bases are clear.  No pericardial fluid.  Small hepatic hypodensities are likely represent cysts. There is a 14 mm gallstone within the fundus the gallbladder. The pancreas is atrophic. No duct dilatation. Spleen adrenal glands normal. Bilateral nonenhancing renal cysts.  The gastric antrum and first portion the duodenum extend through diaphragm hernia defect consistent with a paraesophageal hernia. No evidence of high-grade obstruction. Small bowel and cecum are normal. There diverticulum sigmoid colon without acute inflammation.  Abdominal aorta is normal caliber. No retroperitoneal periportal lymphadenopathy.  No free fluid the pelvis. The uterus and over normal. No pelvic lymphadenopathy.  There is extensive sclerotic changes in the bones of the pelvis and spine. This is felt to relate to a chronic renal disease, degenerative change at the ostia anemia.  IMPRESSION: 1. Paraesophageal hernia with the gastric antrum and first portion the duodenum extending through the hernia defect adjacent to the esophagus. 2. Extensive colonic diverticulosis without evidence of diverticulitis. 3. Large gallstone within the lumen of the gallbladder without evidence acute inflammation. 4. Severe sclerotic changes in spine is likely benign medical renal disease and degenerative change. .   Electronically Signed   By: Genevive Bi M.D.   On: 03/24/2013 01:04  I personally  reviewed the imaging tests through PACS system I reviewed available ER/hospitalization records through the EMR   EKG Interpretation   None       MDM   1. Abdominal pain   2. Esophageal hernia   3. Cholelithiasis    Patient feels much better this time.  No vomiting.  Her nausea is controlled.  She's tolerating oral fluids.  Discharge home in good condition.  CT scan without acute pathology.  She does have cholelithiasis however am not convinced that is the cause of her symptoms today.    Lyanne Co, MD 03/24/13 [phone]

## 2013-03-24 NOTE — ED Notes (Signed)
Pt nauseous, MD informed.

## 2013-05-03 ENCOUNTER — Telehealth: Payer: Self-pay | Admitting: General Practice

## 2013-05-03 NOTE — Telephone Encounter (Signed)
Pharmacy calling for med refills.  Pt does not seem to be a patient of [facility].

## 2013-07-13 ENCOUNTER — Ambulatory Visit (INDEPENDENT_AMBULATORY_CARE_PROVIDER_SITE_OTHER): Payer: Medicare Other | Admitting: Surgery

## 2013-07-20 ENCOUNTER — Encounter (INDEPENDENT_AMBULATORY_CARE_PROVIDER_SITE_OTHER): Payer: Self-pay | Admitting: Surgery

## 2013-08-15 ENCOUNTER — Encounter (HOSPITAL_COMMUNITY): Payer: Self-pay | Admitting: Emergency Medicine

## 2013-08-15 ENCOUNTER — Emergency Department (HOSPITAL_COMMUNITY): Payer: Medicare Other

## 2013-08-15 ENCOUNTER — Emergency Department (HOSPITAL_COMMUNITY)
Admission: EM | Admit: 2013-08-15 | Discharge: 2013-08-15 | Disposition: A | Discharge status: Home / Self Care | Payer: Medicare Other | Attending: Emergency Medicine | Admitting: Emergency Medicine

## 2013-08-15 DIAGNOSIS — Z8619 Personal history of other infectious and parasitic diseases: Secondary | ICD-10-CM | POA: Insufficient documentation

## 2013-08-15 DIAGNOSIS — Z79899 Other long term (current) drug therapy: Secondary | ICD-10-CM | POA: Insufficient documentation

## 2013-08-15 DIAGNOSIS — Z862 Personal history of diseases of the blood and blood-forming organs and certain disorders involving the immune mechanism: Secondary | ICD-10-CM | POA: Insufficient documentation

## 2013-08-15 DIAGNOSIS — R040 Epistaxis: Secondary | ICD-10-CM | POA: Insufficient documentation

## 2013-08-15 DIAGNOSIS — Z8719 Personal history of other diseases of the digestive system: Secondary | ICD-10-CM | POA: Insufficient documentation

## 2013-08-15 DIAGNOSIS — Z9114 Patient's other noncompliance with medication regimen: Secondary | ICD-10-CM

## 2013-08-15 DIAGNOSIS — R51 Headache: Secondary | ICD-10-CM | POA: Insufficient documentation

## 2013-08-15 DIAGNOSIS — I10 Essential (primary) hypertension: Secondary | ICD-10-CM

## 2013-08-15 DIAGNOSIS — Z87891 Personal history of nicotine dependence: Secondary | ICD-10-CM | POA: Insufficient documentation

## 2013-08-15 DIAGNOSIS — Z9119 Patient's noncompliance with other medical treatment and regimen: Secondary | ICD-10-CM | POA: Insufficient documentation

## 2013-08-15 DIAGNOSIS — Z8639 Personal history of other endocrine, nutritional and metabolic disease: Secondary | ICD-10-CM | POA: Insufficient documentation

## 2013-08-15 DIAGNOSIS — Z91199 Patient's noncompliance with other medical treatment and regimen due to unspecified reason: Secondary | ICD-10-CM | POA: Insufficient documentation

## 2013-08-15 DIAGNOSIS — Z8742 Personal history of other diseases of the female genital tract: Secondary | ICD-10-CM | POA: Insufficient documentation

## 2013-08-15 LAB — CBC WITH DIFFERENTIAL/PLATELET
Basophils Absolute: 0 10*3/uL (ref 0.0–0.1)
Basophils Relative: 0 % (ref 0–1)
Eosinophils Absolute: 0.4 10*3/uL (ref 0.0–0.7)
Eosinophils Relative: 7 % — ABNORMAL HIGH (ref 0–5)
HCT: 38.9 % (ref 36.0–46.0)
HEMOGLOBIN: 12.7 g/dL (ref 12.0–15.0)
Lymphocytes Relative: 33 % (ref 12–46)
Lymphs Abs: 1.8 10*3/uL (ref 0.7–4.0)
MCH: 27.3 pg (ref 26.0–34.0)
MCHC: 32.6 g/dL (ref 30.0–36.0)
MCV: 83.7 fL (ref 78.0–100.0)
Monocytes Absolute: 0.9 10*3/uL (ref 0.1–1.0)
Monocytes Relative: 16 % — ABNORMAL HIGH (ref 3–12)
NEUTROS ABS: 2.5 10*3/uL (ref 1.7–7.7)
NEUTROS PCT: 44 % (ref 43–77)
Platelets: 252 10*3/uL (ref 150–400)
RBC: 4.65 MIL/uL (ref 3.87–5.11)
RDW: 16.3 % — AB (ref 11.5–15.5)
WBC: 5.6 10*3/uL (ref 4.0–10.5)

## 2013-08-15 LAB — URINALYSIS, ROUTINE W REFLEX MICROSCOPIC
Bilirubin Urine: NEGATIVE
Glucose, UA: NEGATIVE mg/dL
Ketones, ur: NEGATIVE mg/dL
Leukocytes, UA: NEGATIVE
NITRITE: NEGATIVE
Protein, ur: NEGATIVE mg/dL
SPECIFIC GRAVITY, URINE: 1.012 (ref 1.005–1.030)
Urobilinogen, UA: 1 mg/dL (ref 0.0–1.0)
pH: 7.5 (ref 5.0–8.0)

## 2013-08-15 LAB — COMPREHENSIVE METABOLIC PANEL
ALT: 12 U/L (ref 0–35)
AST: 19 U/L (ref 0–37)
Albumin: 4.2 g/dL (ref 3.5–5.2)
Alkaline Phosphatase: 95 U/L (ref 39–117)
BUN: 9 mg/dL (ref 6–23)
CALCIUM: 10.5 mg/dL (ref 8.4–10.5)
CO2: 25 meq/L (ref 19–32)
CREATININE: 0.65 mg/dL (ref 0.50–1.10)
Chloride: 100 mEq/L (ref 96–112)
GFR, EST NON AFRICAN AMERICAN: 78 mL/min — AB (ref >90)
GLUCOSE: 103 mg/dL — AB (ref 70–99)
Potassium: 3.6 mEq/L — ABNORMAL LOW (ref 3.7–5.3)
Sodium: 142 mEq/L (ref 137–147)
Total Bilirubin: 0.4 mg/dL (ref 0.3–1.2)
Total Protein: 8.3 g/dL (ref 6.0–8.3)

## 2013-08-15 LAB — PROTIME-INR
INR: 0.98 (ref 0.00–1.49)
Prothrombin Time: 13 seconds (ref 11.6–15.2)

## 2013-08-15 LAB — URINE MICROSCOPIC-ADD ON

## 2013-08-15 LAB — PRO B NATRIURETIC PEPTIDE: Pro B Natriuretic peptide (BNP): 144.7 pg/mL (ref 0–450)

## 2013-08-15 LAB — LIPASE, BLOOD: LIPASE: 16 U/L (ref 11–59)

## 2013-08-15 MED ORDER — MIRTAZAPINE 15 MG PO TABS
15.0000 mg | ORAL_TABLET | Freq: Every day | ORAL | Status: DC
  Filled 2013-08-15: qty 1

## 2013-08-15 MED ORDER — MIRTAZAPINE 15 MG PO TABS: 15.0000 mg | ORAL_TABLET | Freq: Every day | ORAL | Status: DC

## 2013-08-15 MED ORDER — CLONIDINE HCL 0.1 MG PO TABS
0.1000 mg | ORAL_TABLET | Freq: Once | ORAL | Status: AC
  Administered 2013-08-15: 0.1 mg via ORAL
  Filled 2013-08-15: qty 1

## 2013-08-15 MED ORDER — MIRTAZAPINE 15 MG PO TABS
15.0000 mg | ORAL_TABLET | Freq: Once | ORAL | Status: AC
  Administered 2013-08-15: 15 mg via ORAL
  Filled 2013-08-15: qty 1

## 2013-08-15 MED ORDER — SODIUM CHLORIDE 0.9 % IV SOLN
INTRAVENOUS | Status: DC
  Administered 2013-08-15: 13:00:00 via INTRAVENOUS

## 2013-08-15 NOTE — ED Notes (Signed)
Pt reports having pain to the back of her head and frequent nosebleeds x 2 weeks. Hx of htn but has been out of meds x 2 weeks.

## 2013-08-15 NOTE — ED Notes (Signed)
Return from xray

## 2013-08-15 NOTE — Discharge Instructions (Signed)
Important that you followup with your doctor in the next couple days have blood pressure rechecked. Prescription renewed for your blood pressure medicine. Take it daily at bedtime. Return for any new or worse symptoms.

## 2013-08-15 NOTE — ED Notes (Signed)
Pt alertx4 wc to lobby 

## 2013-08-15 NOTE — ED Notes (Signed)
Pharmacy notified needs stat med.

## 2013-08-15 NOTE — ED Notes (Signed)
Pt incontinent of urine. Bed and diaper changed.

## 2013-08-15 NOTE — ED Notes (Signed)
Dr. Deretha EmoryZackowski informed of bedtime order for remeron- sts wants stat.

## 2013-08-15 NOTE — ED Notes (Signed)
Patient voided without getting a sample.  Will try again in 30 minutes

## 2013-08-15 NOTE — ED Provider Notes (Addendum)
CSN: [id number]     Arrival date & time 08/15/13  2130 History   First MD Initiated Contact with Patient 08/15/13 1113     Chief Complaint  Patient presents with  . Hypertension  . Headache  . Epistaxis     (Consider location/radiation/quality/duration/timing/severity/associated sxs/prior Treatment) The history is provided by the patient and a relative.   80 year old of female brought in by her daughter. Patient's been noncompliant with her medications for 2 weeks. Patient complaining of left-sided back of head pain now 4/10. Some nosebleeds over the last 2 weeks but none in the last week.Were always on the right side. Patient's blood pressure in triage was 198/96. No fevers. Patient without a lot of other specific complaints. The head pain is throbbing in nature nonradiating not made better or worse by anything. Patient denies shortness of breath or chest pain. Patient has long-standing history of hypertension and is supposed to be on hypertensive meds.  Past Medical History  Diagnosis Date  . Blood type, Rh negative 01/23/09  . Yeast infection   . H/O emphysema   . Hypertension 01/23/09  . H/O measles 01/23/09  . Hx of varicella 01/23/09  . H/O uterine prolapse 01/23/09  . Pelvic relaxation 01/23/09  . Cystocele 02/13/09  . Thyroid cyst   . GI bleed    Past Surgical History  Procedure Laterality Date  . Esophagogastroduodenoscopy N/A 10/25/2012    Procedure: ESOPHAGOGASTRODUODENOSCOPY (EGD);  Surgeon: Charna Waldine, MD;  Location: [facility] ENDOSCOPY;  Service: Endoscopy;  Laterality: N/A;   History reviewed. No pertinent family history. History  Substance Use Topics  . Smoking status: Former Smoker -- 0.00 packs/day    Types: Cigarettes    Quit date: 06/21/1978  . Smokeless tobacco: Not on file  . Alcohol Use: No   OB History   Grav Para Term Preterm Abortions TAB SAB Ect Mult Living                 Review of Systems  Constitutional: Negative for fever.  HENT: Positive for  nosebleeds. Negative for congestion.   Eyes: Negative for visual disturbance.  Respiratory: Negative for shortness of breath.   Cardiovascular: Negative for chest pain.  Gastrointestinal: Negative for abdominal pain.  Genitourinary: Negative for dysuria.  Musculoskeletal: Negative for myalgias.  Skin: Negative for rash.  Neurological: Positive for headaches.  Hematological: Does not bruise/bleed easily.  Psychiatric/Behavioral: Negative for confusion.      Allergies  Review of patient's allergies indicates no known allergies.  Home Medications   Prior to Admission medications   Medication Sig Start Date End Date Taking? Authorizing Provider  ferrous sulfate 325 (65 FE) MG tablet Take 1 tablet (325 mg total) by mouth daily with breakfast. 10/27/12  Yes Clanford Cyndie Mull, MD  HYDROcodone-acetaminophen (NORCO/VICODIN) 5-325 MG per tablet Take 1 tablet by mouth every 6 (six) hours as needed for pain. For pain 10/27/12  Yes Clanford Cyndie Mull, MD  mirtazapine (REMERON) 15 MG tablet Take 15 mg by mouth at bedtime.   Yes Historical Provider, MD  ondansetron (ZOFRAN ODT) 8 MG disintegrating tablet Take 1 tablet (8 mg total) by mouth every 8 (eight) hours as needed for nausea or vomiting. 03/24/13  Yes Lyanne Co, MD  pantoprazole (PROTONIX) 40 MG tablet Take 1 tablet (40 mg total) by mouth daily. 10/27/12  Yes Clanford Cyndie Mull, MD  simvastatin (ZOCOR) 10 MG tablet Take 10 mg by mouth at bedtime.   Yes Historical Provider, MD  mirtazapine (REMERON) 15  MG tablet Take 1 tablet (15 mg total) by mouth at bedtime. 08/15/13   Vanetta MuldersScott Zackowski, MD   BP 175/73  Pulse 79  Temp(Src) 98.3 F (36.8 C) (Oral)  Resp 22  Ht 4\' 10"  (1.473 m)  SpO2 97% Physical Exam  Nursing note and vitals reviewed. Constitutional: She appears well-developed and well-nourished. No distress.  HENT:  Head: Normocephalic and atraumatic.  Mouth/Throat: Oropharynx is clear and moist.  Eyes: Conjunctivae are normal. Pupils  are equal, round, and reactive to light.  Neck: Normal range of motion.  Cardiovascular: Normal rate, regular rhythm and normal heart sounds.   Pulmonary/Chest: Effort normal and breath sounds normal. No respiratory distress.  Abdominal: Soft. Bowel sounds are normal. There is no tenderness.  Musculoskeletal: Normal range of motion.  Neurological: She is alert. No cranial nerve deficit. She exhibits normal muscle tone. Coordination normal.  Skin: Skin is warm. No rash noted.    ED Course  Procedures (including critical care time) Labs Review Labs Reviewed  COMPREHENSIVE METABOLIC PANEL - Abnormal; Notable for the following:    Potassium 3.6 (*)    Glucose, Bld 103 (*)    GFR calc non Af Amer 78 (*)    All other components within normal limits  CBC WITH DIFFERENTIAL - Abnormal; Notable for the following:    RDW 16.3 (*)    Monocytes Relative 16 (*)    Eosinophils Relative 7 (*)    All other components within normal limits  URINALYSIS, ROUTINE W REFLEX MICROSCOPIC - Abnormal; Notable for the following:    Hgb urine dipstick SMALL (*)    All other components within normal limits  LIPASE, BLOOD  PRO B NATRIURETIC PEPTIDE  PROTIME-INR  URINE MICROSCOPIC-ADD ON   Results for orders placed during the hospital encounter of 08/15/13  COMPREHENSIVE METABOLIC PANEL      Result Value Ref Range   Sodium 142  137 - 147 mEq/L   Potassium 3.6 (*) 3.7 - 5.3 mEq/L   Chloride 100  96 - 112 mEq/L   CO2 25  19 - 32 mEq/L   Glucose, Bld 103 (*) 70 - 99 mg/dL   BUN 9  6 - 23 mg/dL   Creatinine, Ser 1.610.65  0.50 - 1.10 mg/dL   Calcium 09.610.5  8.4 - 04.510.5 mg/dL   Total Protein 8.3  6.0 - 8.3 g/dL   Albumin 4.2  3.5 - 5.2 g/dL   AST 19  0 - 37 U/L   ALT 12  0 - 35 U/L   Alkaline Phosphatase 95  39 - 117 U/L   Total Bilirubin 0.4  0.3 - 1.2 mg/dL   GFR calc non Af Amer 78 (*) >90 mL/min   GFR calc Af Amer >90  >90 mL/min  LIPASE, BLOOD      Result Value Ref Range   Lipase 16  11 - 59 U/L  CBC  WITH DIFFERENTIAL      Result Value Ref Range   WBC 5.6  4.0 - 10.5 K/uL   RBC 4.65  3.87 - 5.11 MIL/uL   Hemoglobin 12.7  12.0 - 15.0 g/dL   HCT 40.938.9  81.136.0 - 91.446.0 %   MCV 83.7  78.0 - 100.0 fL   MCH 27.3  26.0 - 34.0 pg   MCHC 32.6  30.0 - 36.0 g/dL   RDW 78.216.3 (*) 95.611.5 - 21.315.5 %   Platelets 252  150 - 400 K/uL   Neutrophils Relative % 44  43 - 77 %  Neutro Abs 2.5  1.7 - 7.7 K/uL   Lymphocytes Relative 33  12 - 46 %   Lymphs Abs 1.8  0.7 - 4.0 K/uL   Monocytes Relative 16 (*) 3 - 12 %   Monocytes Absolute 0.9  0.1 - 1.0 K/uL   Eosinophils Relative 7 (*) 0 - 5 %   Eosinophils Absolute 0.4  0.0 - 0.7 K/uL   Basophils Relative 0  0 - 1 %   Basophils Absolute 0.0  0.0 - 0.1 K/uL  URINALYSIS, ROUTINE W REFLEX MICROSCOPIC      Result Value Ref Range   Color, Urine YELLOW  YELLOW   APPearance CLEAR  CLEAR   Specific Gravity, Urine 1.012  1.005 - 1.030   pH 7.5  5.0 - 8.0   Glucose, UA NEGATIVE  NEGATIVE mg/dL   Hgb urine dipstick SMALL (*) NEGATIVE   Bilirubin Urine NEGATIVE  NEGATIVE   Ketones, ur NEGATIVE  NEGATIVE mg/dL   Protein, ur NEGATIVE  NEGATIVE mg/dL   Urobilinogen, UA 1.0  0.0 - 1.0 mg/dL   Nitrite NEGATIVE  NEGATIVE   Leukocytes, UA NEGATIVE  NEGATIVE  PRO B NATRIURETIC PEPTIDE      Result Value Ref Range   Pro B Natriuretic peptide (BNP) 144.7  0 - 450 pg/mL  PROTIME-INR      Result Value Ref Range   Prothrombin Time 13.0  11.6 - 15.2 seconds   INR 0.98  0.00 - 1.49  URINE MICROSCOPIC-ADD ON      Result Value Ref Range   Squamous Epithelial / LPF RARE  RARE   RBC / HPF 3-6  <3 RBC/hpf   Bacteria, UA RARE  RARE     Imaging Review Dg Chest 2 View  08/15/2013   CLINICAL DATA:  Headache, epistaxis  EXAM: CHEST  2 VIEW  COMPARISON:  10/24/2012  FINDINGS: Borderline cardiomegaly. Large hiatal hernia again noted. No acute infiltrate or pleural effusion. No pulmonary edema. Mild degenerative changes thoracic spine. Extensive degenerative changes bilateral  shoulders.  IMPRESSION: No active cardiopulmonary disease.  Large hiatal hernia again noted.   Electronically Signed   By: Natasha Mead M.D.   On: 08/15/2013 14:10   Ct Head Wo Contrast  08/15/2013   CLINICAL DATA:  Hypertension, nosebleeds  EXAM: CT HEAD WITHOUT CONTRAST  TECHNIQUE: Contiguous axial images were obtained from the base of the skull through the vertex without intravenous contrast.  COMPARISON:  None.  FINDINGS: Bony calvarium is intact. A few rounded soft tissue densities are noted within the ethmoid sinuses consistent with mucosal retention cyst.  Mild atrophic changes are seen. No findings to suggest acute hemorrhage, acute infarction or space-occupying mass lesion are noted.  IMPRESSION: No acute intracranial abnormality is noted. Mild atrophic changes are seen.  Mucosal retention cyst within the ethmoid sinuses.   Electronically Signed   By: Alcide Clever M.D.   On: 08/15/2013 13:52     EKG Interpretation None      MDM   Final diagnoses:  Essential hypertension  Noncompliance with medication regimen    Patient brought in by daughter for a rock several concerns. Patient with a known history of hypertension but has not been taking her meds for a while according to the daughter. Patient's had been occasional right-sided nosebleed but not in the last week. Patient's had a cough for 2 days. Patient had pain to the right back of her head 4/10 that is a new symptom.  Workup here without any significant  findings. Headache now minimal no specific treatment. Head CT negative. Chest x-ray negative for pneumonia. Patient without urinary tract infection no leukocytosis no anemia. Labs including liver function tests all negative. Patient's INR is normal and patient's BNP is not significantly elevated.  Patient's blood pressure remained elevated here given her normal blood pressure medicine that she hasn't taken for 2 weeks. Blood pressure improves some started out with a systolic around 200.  Now down to around 180 and her diastolics are below 90. Patient also given clonidine 0.1 mg as well. Patient has a primary care Dr. Patient will need close followup with her primary care doctor for blood pressure checks in nature this gets under control. Patient given prescription to restart back on her blood pressure medicines. No evidence of any stroke no evidence of head bleed no evidence of pneumonia or congestive heart failure.    Vanetta MuldersScott Zackowski, MD 08/15/13 1738  Vanetta MuldersScott Zackowski, MD 08/15/13 1744

## 2015-01-27 ENCOUNTER — Emergency Department (HOSPITAL_COMMUNITY)
Admission: EM | Admit: 2015-01-27 | Discharge: 2015-01-27 | Disposition: A | Discharge status: Home / Self Care | Payer: Medicare Other | Attending: Emergency Medicine | Admitting: Emergency Medicine

## 2015-01-27 ENCOUNTER — Emergency Department (HOSPITAL_COMMUNITY): Payer: Medicare Other

## 2015-01-27 ENCOUNTER — Encounter (HOSPITAL_COMMUNITY): Payer: Self-pay

## 2015-01-27 DIAGNOSIS — R109 Unspecified abdominal pain: Secondary | ICD-10-CM

## 2015-01-27 DIAGNOSIS — N179 Acute kidney failure, unspecified: Secondary | ICD-10-CM | POA: Diagnosis not present

## 2015-01-27 DIAGNOSIS — Z87891 Personal history of nicotine dependence: Secondary | ICD-10-CM | POA: Diagnosis not present

## 2015-01-27 DIAGNOSIS — J439 Emphysema, unspecified: Secondary | ICD-10-CM | POA: Diagnosis not present

## 2015-01-27 DIAGNOSIS — Z79899 Other long term (current) drug therapy: Secondary | ICD-10-CM | POA: Diagnosis not present

## 2015-01-27 DIAGNOSIS — I10 Essential (primary) hypertension: Secondary | ICD-10-CM | POA: Insufficient documentation

## 2015-01-27 LAB — CBC WITH DIFFERENTIAL/PLATELET
BASOS PCT: 0 %
Basophils Absolute: 0 10*3/uL (ref 0.0–0.1)
EOS ABS: 0 10*3/uL (ref 0.0–0.7)
Eosinophils Relative: 0 %
HEMATOCRIT: 35.8 % — AB (ref 36.0–46.0)
Hemoglobin: 11.4 g/dL — ABNORMAL LOW (ref 12.0–15.0)
Lymphocytes Relative: 10 %
Lymphs Abs: 1.1 10*3/uL (ref 0.7–4.0)
MCH: 27.1 pg (ref 26.0–34.0)
MCHC: 31.8 g/dL (ref 30.0–36.0)
MCV: 85.2 fL (ref 78.0–100.0)
MONO ABS: 0.8 10*3/uL (ref 0.1–1.0)
MONOS PCT: 7 %
Neutro Abs: 9.3 10*3/uL — ABNORMAL HIGH (ref 1.7–7.7)
Neutrophils Relative %: 83 %
Platelets: 195 10*3/uL (ref 150–400)
RBC: 4.2 MIL/uL (ref 3.87–5.11)
RDW: 14.5 % (ref 11.5–15.5)
WBC: 11.2 10*3/uL — ABNORMAL HIGH (ref 4.0–10.5)

## 2015-01-27 LAB — LIPASE, BLOOD: Lipase: 21 U/L (ref 11–51)

## 2015-01-27 LAB — COMPREHENSIVE METABOLIC PANEL
ALT: 21 U/L (ref 14–54)
AST: 27 U/L (ref 15–41)
Albumin: 4.1 g/dL (ref 3.5–5.0)
Alkaline Phosphatase: 89 U/L (ref 38–126)
Anion gap: 12 (ref 5–15)
BILIRUBIN TOTAL: 0.7 mg/dL (ref 0.3–1.2)
BUN: 31 mg/dL — AB (ref 6–20)
CALCIUM: 10.5 mg/dL — AB (ref 8.9–10.3)
CO2: 24 mmol/L (ref 22–32)
Chloride: 105 mmol/L (ref 101–111)
Creatinine, Ser: 1.22 mg/dL — ABNORMAL HIGH (ref 0.44–1.00)
GFR calc Af Amer: 45 mL/min — ABNORMAL LOW (ref >60)
GFR calc non Af Amer: 39 mL/min — ABNORMAL LOW (ref >60)
GLUCOSE: 124 mg/dL — AB (ref 65–99)
Potassium: 4 mmol/L (ref 3.5–5.1)
Sodium: 141 mmol/L (ref 135–145)
TOTAL PROTEIN: 8.4 g/dL — AB (ref 6.5–8.1)

## 2015-01-27 MED ORDER — SODIUM CHLORIDE 0.9 % IV SOLN
Freq: Once | INTRAVENOUS | Status: AC
  Administered 2015-01-27: 08:00:00 via INTRAVENOUS

## 2015-01-27 MED ORDER — SODIUM CHLORIDE 0.9 % IV BOLUS (SEPSIS)
1000.0000 mL | Freq: Once | INTRAVENOUS | Status: AC
  Administered 2015-01-27: 1000 mL via INTRAVENOUS

## 2015-01-27 NOTE — ED Notes (Signed)
Pt transported to and from radiology on stretcher with tech, tolerated well.  

## 2015-01-27 NOTE — ED Notes (Signed)
Daughter called back and will come to the ED now to pick up patient.

## 2015-01-27 NOTE — ED Notes (Signed)
Called daughter with number on files wrong number. Called granddaughter who said to call [phone]. Left message to call back [place].

## 2015-01-27 NOTE — Discharge Instructions (Signed)
As discussed, with today's findings it is very important that you follow-up with your primary care physician for a repeat laboratory evaluation by the end of the week.  In the interim, please drink plenty fluids, monitor your condition carefully, and return here for concerning changes in your condition.

## 2015-01-27 NOTE — ED Notes (Signed)
Per EMS, pt c/o abdominal pain last night to family with 1 episode of vomiting.  Pt denies any pain on arrival.

## 2015-01-27 NOTE — ED Notes (Signed)
Daughter arrived at bedside.

## 2015-01-30 NOTE — ED Provider Notes (Signed)
CSN: [id number]     Arrival date & time 01/27/15  0717 History   First MD Initiated Contact with Patient 01/27/15 [phone]     Chief Complaint  Patient presents with  . Abdominal Pain   Late note  (Consider location/radiation/quality/duration/timing/severity/associated sxs/prior Treatment) HPI Patient presents after an episode of abdominal pain yesterday, one episode of vomiting yesterday. Patient is here with a family member, who acknowledged that the patient seems to have memory issues, though no clear diagnosis of dementia. The patient specifically denies any pain, nausea, complaints at all. She acquiesces to evaluation. She denies recent medication changes, diet changes, activity changes. Daughter states that patient lives at home, and concurs that currently the patient appears at baseline.  Past Medical History  Diagnosis Date  . Blood type, Rh negative 01/23/09  . Yeast infection   . H/O emphysema   . Hypertension 01/23/09  . H/O measles 01/23/09  . Hx of varicella 01/23/09  . H/O uterine prolapse 01/23/09  . Pelvic relaxation 01/23/09  . Cystocele 02/13/09  . Thyroid cyst   . GI bleed    Past Surgical History  Procedure Laterality Date  . Esophagogastroduodenoscopy N/A 10/25/2012    Procedure: ESOPHAGOGASTRODUODENOSCOPY (EGD);  Surgeon: Charna Dalilah, MD;  Location: [facility] ENDOSCOPY;  Service: Endoscopy;  Laterality: N/A;   History reviewed. No pertinent family history. Social History  Substance Use Topics  . Smoking status: Former Smoker -- 0.00 packs/day    Types: Cigarettes    Quit date: 06/21/1978  . Smokeless tobacco: None  . Alcohol Use: No   OB History    No data available     Review of Systems  Constitutional:       Per HPI, otherwise negative  HENT:       Per HPI, otherwise negative  Respiratory:       Per HPI, otherwise negative  Cardiovascular:       Per HPI, otherwise negative  Gastrointestinal: Positive for vomiting.  Endocrine:       Negative aside from  HPI  Genitourinary:       Neg aside from HPI   Musculoskeletal:       Per HPI, otherwise negative  Skin: Negative.   Neurological: Negative for syncope.  Psychiatric/Behavioral: Positive for confusion.      Allergies  Review of patient's allergies indicates no known allergies.  Home Medications   Prior to Admission medications   Medication Sig Start Date End Date Taking? Authorizing Provider  amLODipine (NORVASC) 10 MG tablet Take 10 mg by mouth daily. 12/27/14  Yes Historical Provider, MD  baclofen (LIORESAL) 10 MG tablet Take 10 mg by mouth 2 (two) times daily as needed. spasms 12/27/14  Yes Historical Provider, MD  ferrous sulfate 325 (65 FE) MG tablet Take 1 tablet (325 mg total) by mouth daily with breakfast. 10/27/12  Yes Clanford Cyndie Mull, MD  HYDROcodone-acetaminophen (NORCO/VICODIN) 5-325 MG per tablet Take 1 tablet by mouth every 6 (six) hours as needed for pain. For pain 10/27/12  Yes Clanford Cyndie Mull, MD  lisinopril-hydrochlorothiazide (PRINZIDE,ZESTORETIC) 20-12.5 MG tablet Take 1 tablet by mouth daily. 01/19/15  Yes Historical Provider, MD  mirtazapine (REMERON) 15 MG tablet Take 1 tablet (15 mg total) by mouth at bedtime. 08/15/13  Yes Vanetta Mulders, MD  simvastatin (ZOCOR) 10 MG tablet Take 10 mg by mouth at bedtime.   Yes Historical Provider, MD   BP 127/57 mmHg  Pulse 74  Temp(Src) 98.2 F (36.8 C) (Oral)  Resp 18  SpO2  96% Physical Exam  Constitutional: She is oriented to person, place, and time. She has a sickly appearance. No distress.  HENT:  Head: Normocephalic and atraumatic.  Mouth/Throat: Mucous membranes are dry.  Eyes: Conjunctivae and EOM are normal.  Cardiovascular: Normal rate and regular rhythm.   Pulmonary/Chest: Effort normal and breath sounds normal. No stridor. No respiratory distress.  Abdominal: She exhibits no distension.  Musculoskeletal: She exhibits no edema.  Neurological: She is alert and oriented to person, place, and time. No  cranial nerve deficit.  Skin: Skin is warm and dry.  Psychiatric: She has a normal mood and affect.  Nursing note and vitals reviewed.   ED Course  Procedures (including critical care time) Labs Review Labs Reviewed  COMPREHENSIVE METABOLIC PANEL - Abnormal; Notable for the following:    Glucose, Bld 124 (*)    BUN 31 (*)    Creatinine, Ser 1.22 (*)    Calcium 10.5 (*)    Total Protein 8.4 (*)    GFR calc non Af Amer 39 (*)    GFR calc Af Amer 45 (*)    All other components within normal limits  CBC WITH DIFFERENTIAL/PLATELET - Abnormal; Notable for the following:    WBC 11.2 (*)    Hemoglobin 11.4 (*)    HCT 35.8 (*)    Neutro Abs 9.3 (*)    All other components within normal limits  LIPASE, BLOOD    On repeat exam following 1 L fluid resuscitation, the patient appears slightly better, though she continues to deny any complaints but I discussed all findings with the patient's family.   MDM   Final diagnoses:  Abdominal pain, unspecified abdominal location  Acute kidney injury [facility](HCC)   Patient presents after an episode of abdominal pain and her yesterday, with no ongoing complaint per However, the patient does have evidence for dehydration, with mild elevation in creatinine, mild leukocytosis. No obvious source of infection per Patient presents essentially with fluid resuscitation, was hemodynamically stable throughout, discharged with specific instructions to have labs rechecked within the next few days to ensure improvement in her condition, as well as provided return precautions.  Gerhard Munchobert Sherley Leser, MD 01/30/15 2129

## 2015-09-11 ENCOUNTER — Emergency Department (HOSPITAL_COMMUNITY)
Admission: EM | Admit: 2015-09-11 | Discharge: 2015-09-11 | Disposition: A | Discharge status: Home / Self Care | Payer: Medicare Other | Attending: Emergency Medicine | Admitting: Emergency Medicine

## 2015-09-11 ENCOUNTER — Emergency Department (HOSPITAL_COMMUNITY): Payer: Medicare Other

## 2015-09-11 ENCOUNTER — Encounter (HOSPITAL_COMMUNITY): Payer: Self-pay

## 2015-09-11 DIAGNOSIS — R51 Headache: Secondary | ICD-10-CM | POA: Insufficient documentation

## 2015-09-11 DIAGNOSIS — Z87891 Personal history of nicotine dependence: Secondary | ICD-10-CM | POA: Insufficient documentation

## 2015-09-11 DIAGNOSIS — I10 Essential (primary) hypertension: Secondary | ICD-10-CM

## 2015-09-11 LAB — CBC WITH DIFFERENTIAL/PLATELET
Basophils Absolute: 0 10*3/uL (ref 0.0–0.1)
Basophils Relative: 0 %
Eosinophils Absolute: 0.4 10*3/uL (ref 0.0–0.7)
Eosinophils Relative: 5 %
HCT: 38.3 % (ref 36.0–46.0)
HEMOGLOBIN: 12.2 g/dL (ref 12.0–15.0)
LYMPHS ABS: 2.1 10*3/uL (ref 0.7–4.0)
Lymphocytes Relative: 26 %
MCH: 27.1 pg (ref 26.0–34.0)
MCHC: 31.9 g/dL (ref 30.0–36.0)
MCV: 84.9 fL (ref 78.0–100.0)
Monocytes Absolute: 1.1 10*3/uL — ABNORMAL HIGH (ref 0.1–1.0)
Monocytes Relative: 14 %
NEUTROS ABS: 4.5 10*3/uL (ref 1.7–7.7)
Neutrophils Relative %: 55 %
Platelets: 213 10*3/uL (ref 150–400)
RBC: 4.51 MIL/uL (ref 3.87–5.11)
RDW: 15.1 % (ref 11.5–15.5)
WBC: 8.2 10*3/uL (ref 4.0–10.5)

## 2015-09-11 LAB — BASIC METABOLIC PANEL
ANION GAP: 8 (ref 5–15)
BUN: 14 mg/dL (ref 6–20)
CHLORIDE: 106 mmol/L (ref 101–111)
CO2: 27 mmol/L (ref 22–32)
Calcium: 9.8 mg/dL (ref 8.9–10.3)
Creatinine, Ser: 0.78 mg/dL (ref 0.44–1.00)
GFR calc Af Amer: >60 mL/min (ref >60)
GFR calc non Af Amer: >60 mL/min (ref >60)
GLUCOSE: 100 mg/dL — AB (ref 65–99)
POTASSIUM: 3.1 mmol/L — AB (ref 3.5–5.1)
Sodium: 141 mmol/L (ref 135–145)

## 2015-09-11 MED ORDER — POTASSIUM CHLORIDE CRYS ER 20 MEQ PO TBCR
40.0000 meq | EXTENDED_RELEASE_TABLET | Freq: Once | ORAL | Status: AC
  Administered 2015-09-11: 40 meq via ORAL
  Filled 2015-09-11: qty 2

## 2015-09-11 MED ORDER — HYDRALAZINE HCL 20 MG/ML IJ SOLN
2.0000 mg | Freq: Once | INTRAMUSCULAR | Status: AC
  Administered 2015-09-11: 2 mg via INTRAVENOUS
  Filled 2015-09-11: qty 1

## 2015-09-11 MED ORDER — SODIUM CHLORIDE 0.9 % IV SOLN
INTRAVENOUS | Status: DC
  Administered 2015-09-11: 17:00:00 via INTRAVENOUS

## 2015-09-11 NOTE — ED Provider Notes (Signed)
CSN: [id number]     Arrival date & time 09/11/15  1636 History   First MD Initiated Contact with Patient 09/11/15 1650     Chief Complaint  Patient presents with  . Hypertension     (Consider location/radiation/quality/duration/timing/severity/associated sxs/prior Treatment) HPI Comments: This is a 82 year old female who presents from her doctor's office due to high blood pressure. Patient states that she has not had chest pain shortness of breath. Has had a mild headache for several months which is unchanged. No vomiting or mental status changes. No neck pain. No weakness to her arms or legs. Has been compliant with her high blood pressure medication. Blood pressure was elevated in the office and she was sent here for further management. No treatment given prior to arrival  Patient is a 82 y.o. female presenting with hypertension. The history is provided by the patient and a relative.  Hypertension    Past Medical History  Diagnosis Date  . Blood type, Rh negative 01/23/09  . Yeast infection   . H/O emphysema   . Hypertension 01/23/09  . H/O measles 01/23/09  . Hx of varicella 01/23/09  . H/O uterine prolapse 01/23/09  . Pelvic relaxation 01/23/09  . Cystocele 02/13/09  . Thyroid cyst   . GI bleed    Past Surgical History  Procedure Laterality Date  . Esophagogastroduodenoscopy N/A 10/25/2012    Procedure: ESOPHAGOGASTRODUODENOSCOPY (EGD);  Surgeon: Charna Salvatore, MD;  Location: [facility] ENDOSCOPY;  Service: Endoscopy;  Laterality: N/A;   No family history on file. Social History  Substance Use Topics  . Smoking status: Former Smoker -- 0.00 packs/day    Types: Cigarettes    Quit date: 06/21/1978  . Smokeless tobacco: None  . Alcohol Use: No   OB History    No data available     Review of Systems  All other systems reviewed and are negative.     Allergies  Review of patient's allergies indicates no known allergies.  Home Medications   Prior to Admission medications    Medication Sig Start Date End Date Taking? Authorizing Provider  amLODipine (NORVASC) 10 MG tablet Take 10 mg by mouth daily. 12/27/14   Historical Provider, MD  baclofen (LIORESAL) 10 MG tablet Take 10 mg by mouth 2 (two) times daily as needed. spasms 12/27/14   Historical Provider, MD  ferrous sulfate 325 (65 FE) MG tablet Take 1 tablet (325 mg total) by mouth daily with breakfast. 10/27/12   Clanford Cyndie Mull, MD  HYDROcodone-acetaminophen (NORCO/VICODIN) 5-325 MG per tablet Take 1 tablet by mouth every 6 (six) hours as needed for pain. For pain 10/27/12   Clanford Cyndie Mull, MD  lisinopril-hydrochlorothiazide (PRINZIDE,ZESTORETIC) 20-12.5 MG tablet Take 1 tablet by mouth daily. 01/19/15   Historical Provider, MD  mirtazapine (REMERON) 15 MG tablet Take 1 tablet (15 mg total) by mouth at bedtime. 08/15/13   Vanetta Mulders, MD  simvastatin (ZOCOR) 10 MG tablet Take 10 mg by mouth at bedtime.    Historical Provider, MD   BP 241/107 mmHg  Pulse 102  Temp(Src) 98.3 F (36.8 C) (Oral)  Resp 18  SpO2 99% Physical Exam  Constitutional: She is oriented to person, place, and time. She appears well-developed and well-nourished.  Non-toxic appearance. No distress.  HENT:  Head: Normocephalic and atraumatic.  Eyes: Conjunctivae, EOM and lids are normal. Pupils are equal, round, and reactive to light.  Neck: Normal range of motion. Neck supple. No tracheal deviation present. No thyroid mass present.  Cardiovascular: Normal rate,  regular rhythm and normal heart sounds.  Exam reveals no gallop.   No murmur heard. Pulmonary/Chest: Effort normal and breath sounds normal. No stridor. No respiratory distress. She has no decreased breath sounds. She has no wheezes. She has no rhonchi. She has no rales.  Abdominal: Soft. Normal appearance and bowel sounds are normal. She exhibits no distension. There is no tenderness. There is no rebound and no CVA tenderness.  Musculoskeletal: Normal range of motion. She  exhibits no edema or tenderness.  Neurological: She is alert and oriented to person, place, and time. She has normal strength. No cranial nerve deficit or sensory deficit. GCS eye subscore is 4. GCS verbal subscore is 5. GCS motor subscore is 6.  Skin: Skin is warm and dry. No abrasion and no rash noted.  Psychiatric: She has a normal mood and affect. Her speech is normal and behavior is normal.  Nursing note and vitals reviewed.   ED Course  Procedures (including critical care time) Labs Review Labs Reviewed  CBC WITH DIFFERENTIAL/PLATELET  BASIC METABOLIC PANEL    Imaging Review No results found. I have personally reviewed and evaluated these images and lab results as part of my medical decision-making.   EKG Interpretation None      MDM   Final diagnoses:  None    Patient given hydralazine 2 mg IV push in blood pressure has improved. Patient's mild hypokalemia treated with potassium 40 mEq. Patient instructed to follow-up with her Dr. tomorrow for blood pressure management    Lorre NickAnthony Ramesses Crampton, MD 09/11/15 [phone]

## 2015-09-11 NOTE — ED Notes (Signed)
Pt presents with c/o hypertension. Pt reports she was seen at her doctors office today and sent her because her BP was elevated. Pt reports she has a headache but family reports she has had a headache for approx 6 months.

## 2015-09-11 NOTE — Discharge Instructions (Signed)
Hypertension Hypertension, commonly called high blood pressure, is when the force of blood pumping through your arteries is too strong. Your arteries are the blood vessels that carry blood from your heart throughout your body. A blood pressure reading consists of a higher number over a lower number, such as 110/72. The higher number (systolic) is the pressure inside your arteries when your heart pumps. The lower number (diastolic) is the pressure inside your arteries when your heart relaxes. Ideally you want your blood pressure below 120/80. Hypertension forces your heart to work harder to pump blood. Your arteries may become narrow or stiff. Having untreated or uncontrolled hypertension can cause heart attack, stroke, kidney disease, and other problems. RISK FACTORS Some risk factors for high blood pressure are controllable. Others are not.  Risk factors you cannot control include:   Race. You may be at higher risk if you are African American.  Age. Risk increases with age.  Gender. Men are at higher risk than women before age 45 years. After age 65, women are at higher risk than men. Risk factors you can control include:  Not getting enough exercise or physical activity.  Being overweight.  Getting too much fat, sugar, calories, or salt in your diet.  Drinking too much alcohol. SIGNS AND SYMPTOMS Hypertension does not usually cause signs or symptoms. Extremely high blood pressure (hypertensive crisis) may cause headache, anxiety, shortness of breath, and nosebleed. DIAGNOSIS To check if you have hypertension, your health care provider will measure your blood pressure while you are seated, with your arm held at the level of your heart. It should be measured at least twice using the same arm. Certain conditions can cause a difference in blood pressure between your right and left arms. A blood pressure reading that is higher than normal on one occasion does not mean that you need treatment. If  it is not clear whether you have high blood pressure, you may be asked to return on a different day to have your blood pressure checked again. Or, you may be asked to monitor your blood pressure at home for 1 or more weeks. TREATMENT Treating high blood pressure includes making lifestyle changes and possibly taking medicine. Living a healthy lifestyle can help lower high blood pressure. You may need to change some of your habits. Lifestyle changes may include:  Following the DASH diet. This diet is high in fruits, vegetables, and whole grains. It is low in salt, red meat, and added sugars.  Keep your sodium intake below 2,300 mg per day.  Getting at least 30-45 minutes of aerobic exercise at least 4 times per week.  Losing weight if necessary.  Not smoking.  Limiting alcoholic beverages.  Learning ways to reduce stress. Your health care provider may prescribe medicine if lifestyle changes are not enough to get your blood pressure under control, and if one of the following is true:  You are 18-59 years of age and your systolic blood pressure is above 140.  You are 60 years of age or older, and your systolic blood pressure is above 150.  Your diastolic blood pressure is above 90.  You have diabetes, and your systolic blood pressure is over 140 or your diastolic blood pressure is over 90.  You have kidney disease and your blood pressure is above 140/90.  You have heart disease and your blood pressure is above 140/90. Your personal target blood pressure may vary depending on your medical conditions, your age, and other factors. HOME CARE INSTRUCTIONS    Have your blood pressure rechecked as directed by your health care provider.   Take medicines only as directed by your health care provider. Follow the directions carefully. Blood pressure medicines must be taken as prescribed. The medicine does not work as well when you skip doses. Skipping doses also puts you at risk for  problems.  Do not smoke.   Monitor your blood pressure at home as directed by your health care provider. SEEK MEDICAL CARE IF:   You think you are having a reaction to medicines taken.  You have recurrent headaches or feel dizzy.  You have swelling in your ankles.  You have trouble with your vision. SEEK IMMEDIATE MEDICAL CARE IF:  You develop a severe headache or confusion.  You have unusual weakness, numbness, or feel faint.  You have severe chest or abdominal pain.  You vomit repeatedly.  You have trouble breathing. MAKE SURE YOU:   Understand these instructions.  Will watch your condition.  Will get help right away if you are not doing well or get worse.   This information is not intended to replace advice given to you by your health care provider. Make sure you discuss any questions you have with your health care provider.   Document Released: 02/08/2005 Document Revised: 06/25/2014 Document Reviewed: 12/01/2012 Elsevier Interactive Patient Education 2016 [employer]  

## 2017-01-24 ENCOUNTER — Other Ambulatory Visit: Payer: Self-pay

## 2017-01-24 ENCOUNTER — Encounter (HOSPITAL_COMMUNITY): Payer: Self-pay | Admitting: Emergency Medicine

## 2017-01-24 ENCOUNTER — Emergency Department (HOSPITAL_COMMUNITY)
Admission: EM | Admit: 2017-01-24 | Discharge: 2017-01-24 | Disposition: A | Discharge status: Home / Self Care | Payer: Medicare Other | Attending: Emergency Medicine | Admitting: Emergency Medicine

## 2017-01-24 DIAGNOSIS — R109 Unspecified abdominal pain: Secondary | ICD-10-CM | POA: Diagnosis not present

## 2017-01-24 DIAGNOSIS — Z5321 Procedure and treatment not carried out due to patient leaving prior to being seen by health care provider: Secondary | ICD-10-CM | POA: Diagnosis not present

## 2017-01-24 LAB — CBC
HCT: 38 % (ref 36.0–46.0)
Hemoglobin: 12.5 g/dL (ref 12.0–15.0)
MCH: 27.8 pg (ref 26.0–34.0)
MCHC: 32.9 g/dL (ref 30.0–36.0)
MCV: 84.4 fL (ref 78.0–100.0)
Platelets: 281 10*3/uL (ref 150–400)
RBC: 4.5 MIL/uL (ref 3.87–5.11)
RDW: 16.8 % — ABNORMAL HIGH (ref 11.5–15.5)
WBC: 6.4 10*3/uL (ref 4.0–10.5)

## 2017-01-24 LAB — COMPREHENSIVE METABOLIC PANEL
ALK PHOS: 75 U/L (ref 38–126)
ALT: 15 U/L (ref 14–54)
ANION GAP: 12 (ref 5–15)
AST: 22 U/L (ref 15–41)
Albumin: 3.7 g/dL (ref 3.5–5.0)
BUN: 15 mg/dL (ref 6–20)
CALCIUM: 9.7 mg/dL (ref 8.9–10.3)
CO2: 20 mmol/L — ABNORMAL LOW (ref 22–32)
Chloride: 102 mmol/L (ref 101–111)
Creatinine, Ser: 0.84 mg/dL (ref 0.44–1.00)
GFR calc Af Amer: >60 mL/min (ref >60)
GFR calc non Af Amer: 60 mL/min — ABNORMAL LOW (ref >60)
Glucose, Bld: 95 mg/dL (ref 65–99)
Potassium: 3.1 mmol/L — ABNORMAL LOW (ref 3.5–5.1)
SODIUM: 134 mmol/L — AB (ref 135–145)
Total Bilirubin: 0.5 mg/dL (ref 0.3–1.2)
Total Protein: 7.8 g/dL (ref 6.5–8.1)

## 2017-01-24 LAB — LIPASE, BLOOD: LIPASE: 17 U/L (ref 11–51)

## 2017-01-24 NOTE — ED Triage Notes (Signed)
Patient with abdominal pain in left lower quadrant.  She states she can feel a lump in her abdomen.  No nausea or vomiting.  She has been taking Tramadol and tylenol with no relief of the pain.  The pain started yesterday and got worse today.

## 2017-01-24 NOTE — ED Notes (Signed)
Pt informed staff that they were leaving and I was handed their labels.
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
## 2017-01-26 NOTE — Care Management (Signed)
ED CM attempted to contact patient to offer assistance with PCPfollow up appointment, no answer or VM. CM will make second attempt later this evening.

## 2017-03-08 ENCOUNTER — Emergency Department (HOSPITAL_COMMUNITY)
Admission: EM | Admit: 2017-03-08 | Discharge: 2017-03-08 | Disposition: A | Discharge status: Home / Self Care | Payer: Medicare Other | Attending: Emergency Medicine | Admitting: Emergency Medicine

## 2017-03-08 ENCOUNTER — Emergency Department (HOSPITAL_COMMUNITY): Payer: Medicare Other

## 2017-03-08 ENCOUNTER — Other Ambulatory Visit: Payer: Self-pay

## 2017-03-08 DIAGNOSIS — G44209 Tension-type headache, unspecified, not intractable: Secondary | ICD-10-CM | POA: Diagnosis not present

## 2017-03-08 DIAGNOSIS — Z79899 Other long term (current) drug therapy: Secondary | ICD-10-CM | POA: Diagnosis not present

## 2017-03-08 DIAGNOSIS — Z87891 Personal history of nicotine dependence: Secondary | ICD-10-CM | POA: Insufficient documentation

## 2017-03-08 DIAGNOSIS — N183 Chronic kidney disease, stage 3 (moderate): Secondary | ICD-10-CM | POA: Diagnosis not present

## 2017-03-08 DIAGNOSIS — I129 Hypertensive chronic kidney disease with stage 1 through stage 4 chronic kidney disease, or unspecified chronic kidney disease: Secondary | ICD-10-CM | POA: Diagnosis not present

## 2017-03-08 DIAGNOSIS — R51 Headache: Secondary | ICD-10-CM | POA: Diagnosis present

## 2017-03-08 LAB — CBC
HEMATOCRIT: 36.6 % (ref 36.0–46.0)
Hemoglobin: 11.8 g/dL — ABNORMAL LOW (ref 12.0–15.0)
MCH: 28.7 pg (ref 26.0–34.0)
MCHC: 32.2 g/dL (ref 30.0–36.0)
MCV: 89.1 fL (ref 78.0–100.0)
PLATELETS: 293 10*3/uL (ref 150–400)
RBC: 4.11 MIL/uL (ref 3.87–5.11)
RDW: 19.2 % — AB (ref 11.5–15.5)
WBC: 6.9 10*3/uL (ref 4.0–10.5)

## 2017-03-08 LAB — BASIC METABOLIC PANEL
ANION GAP: 9 (ref 5–15)
BUN: 25 mg/dL — ABNORMAL HIGH (ref 6–20)
CALCIUM: 9.9 mg/dL (ref 8.9–10.3)
CO2: 22 mmol/L (ref 22–32)
CREATININE: 0.72 mg/dL (ref 0.44–1.00)
Chloride: 109 mmol/L (ref 101–111)
Glucose, Bld: 107 mg/dL — ABNORMAL HIGH (ref 65–99)
Potassium: 3.6 mmol/L (ref 3.5–5.1)
Sodium: 140 mmol/L (ref 135–145)

## 2017-03-08 MED ORDER — ACETAMINOPHEN 325 MG PO TABS
650.0000 mg | ORAL_TABLET | Freq: Once | ORAL | Status: AC
  Administered 2017-03-08: 650 mg via ORAL
  Filled 2017-03-08: qty 2

## 2017-03-08 NOTE — Discharge Instructions (Signed)
Take over-the-counter medications as needed for your headache, return for fever vomiting worsening symptoms

## 2017-03-08 NOTE — ED Triage Notes (Signed)
Brought to the ED by grandson who states patient lives with another family member. Grandson in process of getting POA.  Patient has not taken any of her prescribed medications for 2 weeks and developed a headache 2 weeks ago. Patient alert answering and following commands appropriate. Pain currently 5/10 dull. Grandson son spoke with social who stated to bring patient to the ED for evaluation. Patient denies CP or SOB.

## 2017-03-08 NOTE — ED Provider Notes (Signed)
[facility] EMERGENCY DEPARTMENT Provider Note   CSN: [id number] Arrival date & time: 03/08/17  1132     History   Chief Complaint Chief Complaint  Patient presents with  . Headache    HPI Shannon Riggs is a 84 y.o. female.  HPI Patient was brought to the emergency room for the evaluation of a headache.  Patient was brought to the emergency room by her grandson.  Apparently social services was involved and there was some concern about the patient not being cared for properly.  She has not been taking her medications. The grandson and granddaughter are now taking custody of the patient and getting power of attorney to care for her.  They were instructed to bring her into the emergency room to get checked out.  Patient has been complaining of a headache in the back of her head for some time.  She denies any nausea or vomiting.  No fevers or chills.  She denies any trouble with any chest pain or shortness of breath.  Patient states her appetite is good.  She denies any complaints. Past Medical History:  Diagnosis Date  . Blood type, Rh negative 01/23/09  . Cystocele 02/13/09  . GI bleed   . H/O emphysema   . H/O measles 01/23/09  . H/O uterine prolapse 01/23/09  . Hx of varicella 01/23/09  . Hypertension 01/23/09  . Pelvic relaxation 01/23/09  . Thyroid cyst   . Yeast infection     Patient Active Problem List   Diagnosis Date Noted  . Syncope and collapse 10/25/2012  . Abnormal urinalysis 10/25/2012  . CKD (chronic kidney disease) stage 3, GFR 30-59 ml/min (HCC) 10/25/2012  . Orthostatic hypotension 10/25/2012  . Protein-calorie malnutrition, severe (HCC) 10/25/2012  . GI bleed 10/24/2012  . Acute blood loss anemia 10/24/2012  . AKI (acute kidney injury) (HCC) 10/24/2012  . HTN (hypertension) 10/24/2012  . Pelvic relaxation 03/16/2010  . Blood type, Rh negative 01/23/2009    Past Surgical History:  Procedure Laterality Date  .  ESOPHAGOGASTRODUODENOSCOPY N/A 10/25/2012   Procedure: ESOPHAGOGASTRODUODENOSCOPY (EGD);  Surgeon: Charna Ewelina, MD;  Location: [facility] ENDOSCOPY;  Service: Endoscopy;  Laterality: N/A;    OB History    No data available       Home Medications    Prior to Admission medications   Medication Sig Start Date End Date Taking? Authorizing Provider  amLODipine (NORVASC) 10 MG tablet Take 10 mg by mouth daily. 12/27/14   [provider]  baclofen (LIORESAL) 10 MG tablet Take 10 mg by mouth 2 (two) times daily as needed. spasms 12/27/14   [provider]  ferrous sulfate 325 (65 FE) MG tablet Take 1 tablet (325 mg total) by mouth daily with breakfast. Patient not taking: Reported on 09/11/2015 10/27/12   Cleora Fleet, MD  lisinopril-hydrochlorothiazide (PRINZIDE,ZESTORETIC) 20-12.5 MG tablet Take 1 tablet by mouth daily. 01/19/15   [provider]  mirtazapine (REMERON) 15 MG tablet Take 1 tablet (15 mg total) by mouth at bedtime. 08/15/13   Vanetta Mulders, MD  simvastatin (ZOCOR) 10 MG tablet Take 10 mg by mouth at bedtime.    [provider]    Family History No family history on file.  Social History Social History   Tobacco Use  . Smoking status: Former Smoker    Packs/day: 0.00    Types: Cigarettes    Last attempt to quit: 06/21/1978    Years since quitting: 38.7  Substance Use Topics  .  Alcohol use: No  . Drug use: No     Allergies   Patient has no known allergies.   Review of Systems Review of Systems  Constitutional: Negative for appetite change, fever and unexpected weight change.  Respiratory: Negative for shortness of breath.   Cardiovascular: Negative for chest pain.  Gastrointestinal: Negative for abdominal pain.  Genitourinary: Negative for dysuria.  Neurological: Positive for headaches. Negative for dizziness, seizures, speech difficulty and numbness.  All other systems reviewed and are negative.    Physical Exam Updated  Vital Signs BP (!) 177/83 (BP Location: Right Arm)   Pulse (!) 113   Temp 97.7 F (36.5 C) (Oral)   Resp 15   Ht 1.473 m (4\' 10" )   Wt 32.2 kg (71 lb)   SpO2 100%   BMI 14.84 kg/m   Physical Exam  Constitutional: No distress.  Thin, frail, laughing and pleasant  HENT:  Head: Normocephalic and atraumatic.  Right Ear: External ear normal.  Left Ear: External ear normal.  Eyes: Conjunctivae are normal. Right eye exhibits no discharge. Left eye exhibits no discharge. No scleral icterus.  Neck: Neck supple. No tracheal deviation present.  Cardiovascular: Normal rate, regular rhythm and intact distal pulses.  Pulmonary/Chest: Effort normal and breath sounds normal. No stridor. No respiratory distress. She has no wheezes. She has no rales.  Abdominal: Soft. Bowel sounds are normal. She exhibits no distension. There is no tenderness. There is no rebound and no guarding.  Musculoskeletal: She exhibits no edema or tenderness.  Neurological: She is alert. She has normal strength. No cranial nerve deficit (no facial droop, extraocular movements intact, no slurred speech) or sensory deficit. She exhibits normal muscle tone. She displays no seizure activity. Coordination normal.  Skin: Skin is warm and dry. No rash noted.  Psychiatric: She has a normal mood and affect.  Nursing note and vitals reviewed.    ED Treatments / Results  Labs (all labs ordered are listed, but only abnormal results are displayed) Labs Reviewed  CBC - Abnormal; Notable for the following components:      Result Value   Hemoglobin 11.8 (*)    RDW 19.2 (*)    All other components within normal limits  BASIC METABOLIC PANEL - Abnormal; Notable for the following components:   Glucose, Bld 107 (*)    BUN 25 (*)    All other components within normal limits    Radiology Ct Head Wo Contrast  Result Date: 03/08/2017 CLINICAL DATA:  Headache. EXAM: CT HEAD WITHOUT CONTRAST TECHNIQUE: Contiguous axial images were  obtained from the base of the skull through the vertex without intravenous contrast. COMPARISON:  CT head dated September 11, 2015. FINDINGS: Evaluation is limited by motion artifact. Brain: No evidence of acute infarction, hemorrhage, hydrocephalus, extra-axial collection or mass lesion/mass effect. Stable age related cerebral atrophy and chronic microvascular ischemic white matter disease. Unchanged encephalomalacia in the right inferior frontal lobe. Vascular: Atherosclerotic calcification.  No hyperdense vessel. Skull: Normal. Negative for fracture or focal lesion. Sinuses/Orbits: Unchanged scattered opacification of the ethmoid air cells and right frontal sinus. The mastoid air cells are clear. No acute orbital finding. Other: None. IMPRESSION: 1. Evaluation is limited by motion artifact. No acute intracranial abnormality. Stable cerebral atrophy and chronic microvascular ischemic white matter disease. Electronically Signed   By: Obie Dredge M.D.   On: 03/08/2017 12:34    Procedures Procedures (including critical care time)  Medications Ordered in ED Medications  acetaminophen (TYLENOL) tablet 650 mg (650  mg Oral Given 03/08/17 1712)     Initial Impression / Assessment and Plan / ED Course  I have reviewed the triage vital signs and the nursing notes.  Pertinent labs & imaging results that were available during my care of the patient were reviewed by me and considered in my medical decision making (see chart for details).  Clinical Course as of Mar 09 1823  Tue Mar 08, 2017  1825 HR 103, regular at the bedside  [JK]    Clinical Course User Index [JK] Linwood DibblesKnapp, Analis Distler, MD    Patient presented to the emergency room for evaluation of a mild headache.  CT scan was done at triage.  No acute findings were noted.  Patient's basic laboratory tests are reassuring.  Patient has plans to follow-up with her primary doctor.  Her blood pressure was a little bit elevated but her family members state they do  have her medications now when she will start taking them..  At this time there does not appear to be any evidence of an acute emergency medical condition and the patient appears stable for discharge with appropriate outpatient follow up.   Final Clinical Impressions(s) / ED Diagnoses   Final diagnoses:  Acute non intractable tension-type headache    ED Discharge Orders    None       Linwood DibblesKnapp, Markeem Noreen, MD 03/08/17 [id number]

## 2017-03-08 NOTE — ED Notes (Signed)
Granddaughter reports that pt was removed from her home of 50 years due to it being condemned today.  She expresses concern for living arrangements of her grandmother; reports home had bed bugs and has been in disarray "for a while".

## 2017-04-05 ENCOUNTER — Encounter (HOSPITAL_COMMUNITY): Payer: Self-pay | Admitting: *Deleted

## 2017-04-05 ENCOUNTER — Other Ambulatory Visit: Payer: Self-pay

## 2017-04-05 ENCOUNTER — Inpatient Hospital Stay (HOSPITAL_COMMUNITY)
Admission: EM | Admit: 2017-04-05 | Discharge: 2017-04-10 | DRG: 552 | Disposition: A | Discharge status: Home Health | Payer: Medicare Other | Attending: Internal Medicine | Admitting: Internal Medicine

## 2017-04-05 ENCOUNTER — Emergency Department (HOSPITAL_COMMUNITY): Payer: Medicare Other

## 2017-04-05 DIAGNOSIS — Z79899 Other long term (current) drug therapy: Secondary | ICD-10-CM

## 2017-04-05 DIAGNOSIS — M5411 Radiculopathy, occipito-atlanto-axial region: Secondary | ICD-10-CM

## 2017-04-05 DIAGNOSIS — M436 Torticollis: Secondary | ICD-10-CM | POA: Diagnosis present

## 2017-04-05 DIAGNOSIS — N390 Urinary tract infection, site not specified: Secondary | ICD-10-CM | POA: Diagnosis not present

## 2017-04-05 DIAGNOSIS — M5090 Cervical disc disorder, unspecified, unspecified cervical region: Secondary | ICD-10-CM | POA: Diagnosis present

## 2017-04-05 DIAGNOSIS — R8290 Unspecified abnormal findings in urine: Secondary | ICD-10-CM | POA: Diagnosis present

## 2017-04-05 DIAGNOSIS — I10 Essential (primary) hypertension: Secondary | ICD-10-CM | POA: Diagnosis present

## 2017-04-05 DIAGNOSIS — M4722 Other spondylosis with radiculopathy, cervical region: Principal | ICD-10-CM | POA: Diagnosis present

## 2017-04-05 DIAGNOSIS — Z9181 History of falling: Secondary | ICD-10-CM

## 2017-04-05 DIAGNOSIS — D72829 Elevated white blood cell count, unspecified: Secondary | ICD-10-CM | POA: Diagnosis present

## 2017-04-05 DIAGNOSIS — J439 Emphysema, unspecified: Secondary | ICD-10-CM | POA: Diagnosis present

## 2017-04-05 DIAGNOSIS — Z681 Body mass index (BMI) 19 or less, adult: Secondary | ICD-10-CM

## 2017-04-05 DIAGNOSIS — K5900 Constipation, unspecified: Secondary | ICD-10-CM | POA: Diagnosis present

## 2017-04-05 DIAGNOSIS — Z87891 Personal history of nicotine dependence: Secondary | ICD-10-CM

## 2017-04-05 DIAGNOSIS — F0390 Unspecified dementia without behavioral disturbance: Secondary | ICD-10-CM | POA: Diagnosis present

## 2017-04-05 DIAGNOSIS — R531 Weakness: Secondary | ICD-10-CM | POA: Diagnosis present

## 2017-04-05 DIAGNOSIS — R636 Underweight: Secondary | ICD-10-CM | POA: Diagnosis present

## 2017-04-05 DIAGNOSIS — D72828 Other elevated white blood cell count: Secondary | ICD-10-CM

## 2017-04-05 DIAGNOSIS — R112 Nausea with vomiting, unspecified: Secondary | ICD-10-CM | POA: Diagnosis present

## 2017-04-05 LAB — CBC WITH DIFFERENTIAL/PLATELET
BASOS ABS: 0 10*3/uL (ref 0.0–0.1)
BASOS PCT: 0 %
EOS PCT: 1 %
Eosinophils Absolute: 0.1 10*3/uL (ref 0.0–0.7)
HEMATOCRIT: 39.1 % (ref 36.0–46.0)
Hemoglobin: 12.6 g/dL (ref 12.0–15.0)
Lymphocytes Relative: 6 %
Lymphs Abs: 0.9 10*3/uL (ref 0.7–4.0)
MCH: 28.3 pg (ref 26.0–34.0)
MCHC: 32.2 g/dL (ref 30.0–36.0)
MCV: 87.7 fL (ref 78.0–100.0)
MONO ABS: 2.1 10*3/uL — AB (ref 0.1–1.0)
MONOS PCT: 14 %
Neutro Abs: 12.4 10*3/uL — ABNORMAL HIGH (ref 1.7–7.7)
Neutrophils Relative %: 79 %
PLATELETS: 301 10*3/uL (ref 150–400)
RBC: 4.46 MIL/uL (ref 3.87–5.11)
RDW: 16.8 % — AB (ref 11.5–15.5)
WBC: 15.4 10*3/uL — ABNORMAL HIGH (ref 4.0–10.5)

## 2017-04-05 LAB — BASIC METABOLIC PANEL
ANION GAP: 15 (ref 5–15)
BUN: 16 mg/dL (ref 6–20)
CALCIUM: 9.8 mg/dL (ref 8.9–10.3)
CO2: 23 mmol/L (ref 22–32)
CREATININE: 0.57 mg/dL (ref 0.44–1.00)
Chloride: 101 mmol/L (ref 101–111)
GLUCOSE: 119 mg/dL — AB (ref 65–99)
Potassium: 3.5 mmol/L (ref 3.5–5.1)
Sodium: 139 mmol/L (ref 135–145)

## 2017-04-05 MED ORDER — IOPAMIDOL (ISOVUE-300) INJECTION 61%
INTRAVENOUS | Status: AC
Start: 2017-04-05 — End: 2017-04-06
  Filled 2017-04-05: qty 75

## 2017-04-05 MED ORDER — LIDOCAINE 5 % EX PTCH
2.0000 | MEDICATED_PATCH | Freq: Once | CUTANEOUS | Status: AC
  Administered 2017-04-05: 2 via TRANSDERMAL
  Filled 2017-04-05: qty 2

## 2017-04-05 MED ORDER — IOPAMIDOL (ISOVUE-300) INJECTION 61%
75.0000 mL | Freq: Once | INTRAVENOUS | Status: AC | PRN
  Administered 2017-04-05: 75 mL via INTRAVENOUS

## 2017-04-05 MED ORDER — FENTANYL CITRATE (PF) 100 MCG/2ML IJ SOLN
25.0000 ug | Freq: Once | INTRAMUSCULAR | Status: AC
  Administered 2017-04-05: 25 ug via INTRAVENOUS
  Filled 2017-04-05: qty 2

## 2017-04-05 MED ORDER — SODIUM CHLORIDE 0.9 % IV BOLUS (SEPSIS)
1000.0000 mL | Freq: Once | INTRAVENOUS | Status: AC
  Administered 2017-04-05: 1000 mL via INTRAVENOUS

## 2017-04-05 MED ORDER — KETOROLAC TROMETHAMINE 15 MG/ML IJ SOLN
15.0000 mg | Freq: Once | INTRAMUSCULAR | Status: AC
  Administered 2017-04-05: 15 mg via INTRAVENOUS
  Filled 2017-04-05: qty 1

## 2017-04-05 NOTE — ED Triage Notes (Signed)
Granddaughter states she come in from work and found her grandmother crying with head and neck pain.

## 2017-04-05 NOTE — ED Provider Notes (Signed)
[facility]-EMERGENCY DEPT Provider Note   CSN: [id number] Arrival date & time: 04/05/17  2013     History   Chief Complaint Chief Complaint  Patient presents with  . Headache  . Torticollis    HPI Shannon Riggs is a 85 y.o. female.  HPI  84 year-old female with past medical history as below here with severe neck pain.  The patient reportedly has been having increasing paracervical neck pain for the last several weeks.  She is been complaining of intermittent severe pain with difficulty flexing her neck.  She had a mild sore throat intermittently during the same time period, but this has come and gone and is currently resolved.  He currently describes severe, throbbing, aching, spasm-like pain in her bilateral paracervical muscles.  The pain is worse with any movement of her head.  Denies any preceding falls.  No recent trauma.  Denies any numbness or weakness in arms or legs.  She does endorse generalized weakness, which she believes is due to difficulty sleeping from this pain.  No fevers or chills.  No abdominal pain, nausea or vomiting.  Pain is worse with movement and palpation.  No alleviating factors.  Past Medical History:  Diagnosis Date  . Blood type, Rh negative 01/23/09  . Cystocele 02/13/09  . GI bleed   . H/O emphysema   . H/O measles 01/23/09  . H/O uterine prolapse 01/23/09  . Hx of varicella 01/23/09  . Hypertension 01/23/09  . Pelvic relaxation 01/23/09  . Thyroid cyst   . Yeast infection     Patient Active Problem List   Diagnosis Date Noted  . Syncope and collapse 10/25/2012  . Abnormal urinalysis 10/25/2012  . CKD (chronic kidney disease) stage 3, GFR 30-59 ml/min (HCC) 10/25/2012  . Orthostatic hypotension 10/25/2012  . Protein-calorie malnutrition, severe (HCC) 10/25/2012  . GI bleed 10/24/2012  . Acute blood loss anemia 10/24/2012  . AKI (acute kidney injury) (HCC) 10/24/2012  . HTN (hypertension) 10/24/2012  . Pelvic  relaxation 03/16/2010  . Blood type, Rh negative 01/23/2009    Past Surgical History:  Procedure Laterality Date  . ESOPHAGOGASTRODUODENOSCOPY N/A 10/25/2012   Procedure: ESOPHAGOGASTRODUODENOSCOPY (EGD);  Surgeon: Charna Martisha, MD;  Location: [facility] ENDOSCOPY;  Service: Endoscopy;  Laterality: N/A;    OB History    No data available       Home Medications    Prior to Admission medications   Medication Sig Start Date End Date Taking? Authorizing Provider  amLODipine (NORVASC) 10 MG tablet Take 10 mg by mouth daily. 12/27/14  Yes [provider]  lisinopril-hydrochlorothiazide (PRINZIDE,ZESTORETIC) 20-12.5 MG tablet Take 1 tablet by mouth daily. 01/19/15  Yes [provider]  ferrous sulfate 325 (65 FE) MG tablet Take 1 tablet (325 mg total) by mouth daily with breakfast. Patient not taking: Reported on 09/11/2015 10/27/12   Cleora Fleet, MD  mirtazapine (REMERON) 15 MG tablet Take 1 tablet (15 mg total) by mouth at bedtime. Patient not taking: Reported on 04/05/2017 08/15/13   Vanetta Mulders, MD    Family History No family history on file.  Social History Social History   Tobacco Use  . Smoking status: Former Smoker    Packs/day: 0.00    Types: Cigarettes    Last attempt to quit: 06/21/1978    Years since quitting: 38.8  . Smokeless tobacco: Never Used  Substance Use Topics  . Alcohol use: No  . Drug use: No     Allergies  Patient has no known allergies.   Review of Systems Review of Systems  Constitutional: Positive for fatigue.  Musculoskeletal: Positive for neck pain and neck stiffness.  Neurological: Positive for headaches.  All other systems reviewed and are negative.    Physical Exam Updated Vital Signs BP (!) 159/82 (BP Location: Right Arm)   Pulse (!) 108   Temp 97.8 F (36.6 C) (Oral)   Resp 17   Ht 4\' 10"  (1.473 m)   Wt 32.2 kg (71 lb)   SpO2 93%   BMI 14.84 kg/m   Physical Exam  Constitutional: She is oriented to  person, place, and time. She appears well-developed and well-nourished. No distress.  HENT:  Head: Normocephalic and atraumatic.  Eyes: Conjunctivae are normal.  Neck: Neck supple.    Exquisite pain and palpable spasm to bilateral paracervical muscles extending to the superior trapezius.  No midline tenderness.  The neck is held in stiff flexion.  Cardiovascular: Normal rate, regular rhythm and normal heart sounds. Exam reveals no friction rub.  No murmur heard. Pulmonary/Chest: Effort normal and breath sounds normal. No respiratory distress. She has no wheezes. She has no rales.  Abdominal: She exhibits no distension.  Musculoskeletal: She exhibits no edema.  Neurological: She is alert and oriented to person, place, and time. She exhibits normal muscle tone.  Strength 5 out of 5 bilateral upper and lower extremities.  Normal biceps and bilateral quadriceps and Achilles tendon reflexes.  Normal sensation light touch proximally distally bilaterally.  Skin: Skin is warm. Capillary refill takes less than 2 seconds.  Psychiatric: She has a normal mood and affect.  Nursing note and vitals reviewed.    ED Treatments / Results  Labs (all labs ordered are listed, but only abnormal results are displayed) Labs Reviewed  CBC WITH DIFFERENTIAL/PLATELET - Abnormal; Notable for the following components:      Result Value   WBC 15.4 (*)    RDW 16.8 (*)    Neutro Abs 12.4 (*)    Monocytes Absolute 2.1 (*)    All other components within normal limits  BASIC METABOLIC PANEL - Abnormal; Notable for the following components:   Glucose, Bld 119 (*)    All other components within normal limits  URINALYSIS, ROUTINE W REFLEX MICROSCOPIC - Abnormal; Notable for the following components:   Leukocytes, UA SMALL (*)    Bacteria, UA RARE (*)    Squamous Epithelial / LPF 0-5 (*)    All other components within normal limits  URINE CULTURE  CULTURE, BLOOD (ROUTINE X 2)  CULTURE, BLOOD (ROUTINE X 2)     EKG  EKG Interpretation  Date/Time:  Tuesday April 05 2017 23:11:34 EST Ventricular Rate:  115 PR Interval:    QRS Duration: 79 QT Interval:  325 QTC Calculation: 450 R Axis:   124 Text Interpretation:  Sinus tachycardia Left posterior fascicular block Anterior infarct, old No significant change since last tracing Confirmed by Shaune PollackIsaacs, Makell Cyr [phone]([id number]) on 04/05/2017 11:22:00 PM       Radiology Dg Chest 2 View  Result Date: 04/06/2017 CLINICAL DATA:  84 year old female with weakness, head and neck pain. EXAM: CHEST  2 VIEW COMPARISON:  01/27/2015 and earlier. FINDINGS: Semi upright AP and lateral views of the chest. Moderate to large gas and fluid containing gastric hiatal hernia. Hernia size not significantly changed compared to 2016. Other mediastinal contours are within normal limits. Visualized tracheal air column is within normal limits. No pneumothorax, pulmonary edema, pleural effusion or confluent pulmonary opacity.  Negative visible bowel gas pattern in the abdomen. Degenerative changes in the spine and at both shoulders. No acute osseous abnormality identified. IMPRESSION: 1.  No acute cardiopulmonary abnormality. 2. Moderate to large chronic gastric hiatal hernia has not significantly changed since 2016. Electronically Signed   By: Odessa Fleming M.D.   On: 04/06/2017 00:14   Ct Head Wo Contrast  Result Date: 04/06/2017 CLINICAL DATA:  Head and neck pain. Leukocytosis. History of hypertension, thyroid cyst. EXAM: CT HEAD WITHOUT CONTRAST CT NECK WITH CONTRAST TECHNIQUE: Contiguous axial images were obtained from the base of the skull through the vertex without intravenous contrast. Multidetector CT imaging of the and neck was performed using the standard protocol following the bolus administration of intravenous contrast. CONTRAST:  75 cc Isovue-300 COMPARISON:  CT HEAD March 08, 2017 FINDINGS: CT HEAD FINDINGS BRAIN: No intraparenchymal hemorrhage, mass effect nor midline shift. The  ventricles and sulci are normal for age. Patchy supratentorial white matter hypodensities within normal range for patient's age, though non-specific are most compatible with chronic small vessel ischemic disease. Small area RIGHT inferior frontal lobe encephalomalacia. No acute large vascular territory infarcts. No abnormal extra-axial fluid collections. Basal cisterns are patent. VASCULAR: Severe calcific atherosclerosis of the carotid siphons. SKULL: No skull fracture. No significant scalp soft tissue swelling. SINUSES/ORBITS: Scattered mucosal retention cyst. Included ocular globes and orbital contents are non-suspicious. Old LEFT medial orbital blowout fracture. Status post bilateral ocular lens implants. OTHER: None. CT NECK FINDINGS PHARYNX AND LARYNX: Normal.  Widely patent airway. SALIVARY GLANDS: Severely atrophic LEFT submandibular gland with 5 mm LEFT submandibular sialolith, 3 mm sialolith LEFT submandibular duct. THYROID: 8 mm LEFT thyroid nodule, below size follow-up evaluation, otherwise unremarkable. LYMPH NODES: No lymphadenopathy by CT size criteria. VASCULAR: Mild calcific atherosclerosis aortic arch. Moderate calcific atherosclerosis bilateral carotid bifurcations, not tailored for assessment of potentially hemodynamically significant stenosis. SKELETON: Grade 1 C4-5 and C5-6 anterolisthesis on degenerative basis. Severe atlantodental osteoarthrosis with subchondral cysts and mild basilar invagination. Associated large amount of pannus. Multilevel severe degenerative discs. No destructive bony lesions. Severe C3-4 through C6-7 neural foraminal narrowing. UPPER CHEST: Severe centrilobular emphysema. No mediastinal lymphadenopathy and included chest. OTHER: None. IMPRESSION: CT HEAD: 1. No acute intracranial process. 2. Moderate chronic small vessel ischemic disease. RIGHT frontal encephalomalacia is likely posttraumatic. CT NECK: 1. No acute process in the neck. 2. LEFT submandibular sialolith  with severe chronic sialadenosis. 3. Severe C1-2 osteoarthrosis associated with large volume pannus and mild basilar invagination associated with inflammatory arthropathy/CPPD. 4. Grade 1 C4-5 and C5-6 anterolisthesis on degenerative basis. No fracture. 5. Severe C3-4 through C6-7 neural foraminal narrowing. Aortic Atherosclerosis (ICD10-I70.0) and Emphysema (ICD10-J43.9). Electronically Signed   By: Awilda Metro M.D.   On: 04/06/2017 00:28   Ct Soft Tissue Neck W Contrast  Result Date: 04/06/2017 CLINICAL DATA:  Head and neck pain. Leukocytosis. History of hypertension, thyroid cyst. EXAM: CT HEAD WITHOUT CONTRAST CT NECK WITH CONTRAST TECHNIQUE: Contiguous axial images were obtained from the base of the skull through the vertex without intravenous contrast. Multidetector CT imaging of the and neck was performed using the standard protocol following the bolus administration of intravenous contrast. CONTRAST:  75 cc Isovue-300 COMPARISON:  CT HEAD March 08, 2017 FINDINGS: CT HEAD FINDINGS BRAIN: No intraparenchymal hemorrhage, mass effect nor midline shift. The ventricles and sulci are normal for age. Patchy supratentorial white matter hypodensities within normal range for patient's age, though non-specific are most compatible with chronic small vessel ischemic disease. Small area RIGHT  inferior frontal lobe encephalomalacia. No acute large vascular territory infarcts. No abnormal extra-axial fluid collections. Basal cisterns are patent. VASCULAR: Severe calcific atherosclerosis of the carotid siphons. SKULL: No skull fracture. No significant scalp soft tissue swelling. SINUSES/ORBITS: Scattered mucosal retention cyst. Included ocular globes and orbital contents are non-suspicious. Old LEFT medial orbital blowout fracture. Status post bilateral ocular lens implants. OTHER: None. CT NECK FINDINGS PHARYNX AND LARYNX: Normal.  Widely patent airway. SALIVARY GLANDS: Severely atrophic LEFT submandibular gland  with 5 mm LEFT submandibular sialolith, 3 mm sialolith LEFT submandibular duct. THYROID: 8 mm LEFT thyroid nodule, below size follow-up evaluation, otherwise unremarkable. LYMPH NODES: No lymphadenopathy by CT size criteria. VASCULAR: Mild calcific atherosclerosis aortic arch. Moderate calcific atherosclerosis bilateral carotid bifurcations, not tailored for assessment of potentially hemodynamically significant stenosis. SKELETON: Grade 1 C4-5 and C5-6 anterolisthesis on degenerative basis. Severe atlantodental osteoarthrosis with subchondral cysts and mild basilar invagination. Associated large amount of pannus. Multilevel severe degenerative discs. No destructive bony lesions. Severe C3-4 through C6-7 neural foraminal narrowing. UPPER CHEST: Severe centrilobular emphysema. No mediastinal lymphadenopathy and included chest. OTHER: None. IMPRESSION: CT HEAD: 1. No acute intracranial process. 2. Moderate chronic small vessel ischemic disease. RIGHT frontal encephalomalacia is likely posttraumatic. CT NECK: 1. No acute process in the neck. 2. LEFT submandibular sialolith with severe chronic sialadenosis. 3. Severe C1-2 osteoarthrosis associated with large volume pannus and mild basilar invagination associated with inflammatory arthropathy/CPPD. 4. Grade 1 C4-5 and C5-6 anterolisthesis on degenerative basis. No fracture. 5. Severe C3-4 through C6-7 neural foraminal narrowing. Aortic Atherosclerosis (ICD10-I70.0) and Emphysema (ICD10-J43.9). Electronically Signed   By: Awilda Metro M.D.   On: 04/06/2017 00:28    Procedures Procedures (including critical care time)  Medications Ordered in ED Medications  lidocaine (LIDODERM) 5 % 2 patch (2 patches Transdermal Patch Applied 04/05/17 2317)  iopamidol (ISOVUE-300) 61 % injection (not administered)  cefTRIAXone (ROCEPHIN) 1 g in sodium chloride 0.9 % 100 mL IVPB (not administered)  0.9 %  sodium chloride infusion (not administered)  fentaNYL (SUBLIMAZE)  injection 25 mcg (25 mcg Intravenous Given 04/05/17 2321)  ketorolac (TORADOL) 15 MG/ML injection 15 mg (15 mg Intravenous Given 04/05/17 2319)  sodium chloride 0.9 % bolus 1,000 mL (0 mLs Intravenous Stopped 04/06/17 0054)  iopamidol (ISOVUE-300) 61 % injection 75 mL (75 mLs Intravenous Contrast Given 04/05/17 2344)  dexamethasone (DECADRON) injection 10 mg (10 mg Intravenous Given 04/06/17 0104)  acetaminophen (TYLENOL) tablet 1,000 mg (1,000 mg Oral Given 04/06/17 0104)  traMADol (ULTRAM) tablet 50 mg (50 mg Oral Given 04/06/17 0112)     Initial Impression / Assessment and Plan / ED Course  I have reviewed the triage vital signs and the nursing notes.  Pertinent labs & imaging results that were available during my care of the patient were reviewed by me and considered in my medical decision making (see chart for details).     84 year old female here with generalized weakness at home, falls, and known neck and head pain.  Patient tachycardic and mildly dehydrated on exam.  She has a moderate leukocytosis.  Based on imaging and labs, I suspect that patient likely has generalized weakness and falls due to UTI.  She is been given Rocephin for this.  I suspect that this has aggravated her chronic, severe C1 and C2 osteoarthritis which has contributed to an acute neuropathy.  Her pain distribution is consistent with this.  She has no signs of cord compression.  I discussed with Costello of neurosurgery, who recommends soft collar  and supportive care.  Decadron given as well for acute inflammation.  Patient will be given Rocephin, fluids, and given her persistent tachycardia with leukocytosis and symptoms of UTI, will admit for fluids, and further management.  Final Clinical Impressions(s) / ED Diagnoses   Final diagnoses:  Radiculopathy of occipito-atlanto-axial region  Other elevated white blood cell (WBC) count  Lower urinary tract infection      Shaune Pollack, MD 04/06/17 0126

## 2017-04-06 ENCOUNTER — Emergency Department (HOSPITAL_COMMUNITY): Payer: Medicare Other

## 2017-04-06 DIAGNOSIS — M5090 Cervical disc disorder, unspecified, unspecified cervical region: Secondary | ICD-10-CM | POA: Diagnosis not present

## 2017-04-06 DIAGNOSIS — I10 Essential (primary) hypertension: Secondary | ICD-10-CM | POA: Diagnosis not present

## 2017-04-06 DIAGNOSIS — D72829 Elevated white blood cell count, unspecified: Secondary | ICD-10-CM

## 2017-04-06 DIAGNOSIS — R8290 Unspecified abnormal findings in urine: Secondary | ICD-10-CM

## 2017-04-06 DIAGNOSIS — R531 Weakness: Secondary | ICD-10-CM | POA: Diagnosis not present

## 2017-04-06 LAB — URINALYSIS, ROUTINE W REFLEX MICROSCOPIC
Bilirubin Urine: NEGATIVE
Glucose, UA: NEGATIVE mg/dL
HGB URINE DIPSTICK: NEGATIVE
Ketones, ur: NEGATIVE mg/dL
NITRITE: NEGATIVE
PROTEIN: NEGATIVE mg/dL
SPECIFIC GRAVITY, URINE: 1.025 (ref 1.005–1.030)
pH: 7 (ref 5.0–8.0)

## 2017-04-06 MED ORDER — SODIUM CHLORIDE 0.9 % IV SOLN
Freq: Once | INTRAVENOUS | Status: AC
  Administered 2017-04-06: 02:00:00 via INTRAVENOUS

## 2017-04-06 MED ORDER — AMLODIPINE BESYLATE 10 MG PO TABS
10.0000 mg | ORAL_TABLET | Freq: Every day | ORAL | Status: DC
  Administered 2017-04-06 – 2017-04-10 (×5): 10 mg via ORAL
  Filled 2017-04-06 (×5): qty 1

## 2017-04-06 MED ORDER — ACETAMINOPHEN 325 MG PO TABS: 650.0000 mg | ORAL_TABLET | Freq: Four times a day (QID) | ORAL | Status: DC | PRN

## 2017-04-06 MED ORDER — ORAL CARE MOUTH RINSE
15.0000 mL | Freq: Two times a day (BID) | OROMUCOSAL | Status: DC
  Administered 2017-04-06 – 2017-04-10 (×7): 15 mL via OROMUCOSAL

## 2017-04-06 MED ORDER — DEXAMETHASONE SODIUM PHOSPHATE 4 MG/ML IJ SOLN
4.0000 mg | Freq: Four times a day (QID) | INTRAMUSCULAR | Status: DC
  Administered 2017-04-06 – 2017-04-07 (×4): 4 mg via INTRAVENOUS
  Filled 2017-04-06 (×5): qty 1

## 2017-04-06 MED ORDER — ONDANSETRON HCL 4 MG/2ML IJ SOLN
4.0000 mg | Freq: Four times a day (QID) | INTRAMUSCULAR | Status: DC | PRN
  Administered 2017-04-08 – 2017-04-09 (×2): 4 mg via INTRAVENOUS
  Filled 2017-04-06 (×2): qty 2

## 2017-04-06 MED ORDER — ACETAMINOPHEN 500 MG PO TABS
1000.0000 mg | ORAL_TABLET | Freq: Once | ORAL | Status: AC
  Administered 2017-04-06: 1000 mg via ORAL
  Filled 2017-04-06: qty 2

## 2017-04-06 MED ORDER — DEXAMETHASONE SODIUM PHOSPHATE 10 MG/ML IJ SOLN
10.0000 mg | Freq: Once | INTRAMUSCULAR | Status: AC
  Administered 2017-04-06: 10 mg via INTRAVENOUS
  Filled 2017-04-06: qty 1

## 2017-04-06 MED ORDER — TRAMADOL HCL 50 MG PO TABS
50.0000 mg | ORAL_TABLET | Freq: Once | ORAL | Status: AC
  Administered 2017-04-06: 50 mg via ORAL
  Filled 2017-04-06: qty 1

## 2017-04-06 MED ORDER — HYDROCODONE-ACETAMINOPHEN 5-325 MG PO TABS
1.0000 | ORAL_TABLET | ORAL | Status: AC
  Administered 2017-04-06: 1 via ORAL
  Filled 2017-04-06: qty 1

## 2017-04-06 MED ORDER — ACETAMINOPHEN 650 MG RE SUPP: 650.0000 mg | Freq: Four times a day (QID) | RECTAL | Status: DC | PRN

## 2017-04-06 MED ORDER — CEFTRIAXONE SODIUM 1 G IJ SOLR
1.0000 g | INTRAMUSCULAR | Status: DC
  Administered 2017-04-06 – 2017-04-09 (×4): 1 g via INTRAVENOUS
  Filled 2017-04-06: qty 1
  Filled 2017-04-06: qty 10
  Filled 2017-04-06 (×3): qty 1

## 2017-04-06 MED ORDER — ENOXAPARIN SODIUM 30 MG/0.3ML ~~LOC~~ SOLN
30.0000 mg | SUBCUTANEOUS | Status: DC
  Administered 2017-04-06 – 2017-04-10 (×5): 30 mg via SUBCUTANEOUS
  Filled 2017-04-06 (×5): qty 0.3

## 2017-04-06 MED ORDER — HYDROCODONE-ACETAMINOPHEN 5-325 MG PO TABS
1.0000 | ORAL_TABLET | ORAL | Status: DC | PRN
  Administered 2017-04-08 – 2017-04-10 (×2): 1 via ORAL
  Filled 2017-04-06 (×2): qty 1

## 2017-04-06 MED ORDER — LIDOCAINE 5 % EX PTCH
2.0000 | MEDICATED_PATCH | CUTANEOUS | Status: DC
  Administered 2017-04-06: 2 via TRANSDERMAL
  Administered 2017-04-07: 22:00:00 1 via TRANSDERMAL
  Administered 2017-04-08 – 2017-04-09 (×2): 2 via TRANSDERMAL
  Filled 2017-04-06 (×4): qty 2

## 2017-04-06 MED ORDER — ONDANSETRON HCL 4 MG PO TABS: 4.0000 mg | ORAL_TABLET | Freq: Four times a day (QID) | ORAL | Status: DC | PRN

## 2017-04-06 MED ORDER — SODIUM CHLORIDE 0.9 % IV SOLN
1.0000 g | Freq: Once | INTRAVENOUS | Status: AC
  Administered 2017-04-06: 1 g via INTRAVENOUS
  Filled 2017-04-06: qty 10

## 2017-04-06 MED ORDER — SODIUM CHLORIDE 0.9 % IV SOLN
INTRAVENOUS | Status: DC
  Administered 2017-04-06 – 2017-04-07 (×3): via INTRAVENOUS

## 2017-04-06 NOTE — ED Notes (Signed)
ED TO INPATIENT HANDOFF REPORT  Name/Age/Gender Shannon Riggs 84 y.o. female  Code Status    Code Status Orders  (From admission, onward)        Start     Ordered   04/06/17 0540  Full code  Continuous     04/06/17 0540    Code Status History    Date Active Date Inactive Code Status Order ID Comments User Context   10/24/2012 20:19 10/27/2012 14:42 Full Code [id number]  Etta Quill, DO ED      Home/SNF/Other Home  Chief Complaint Headache; Torticollis  Level of Care/Admitting Diagnosis ED Disposition    ED Disposition Condition [place]: [facility] [[id number]]  Level of Care: Med-Surg [16]  Diagnosis: Cervical neck pain with evidence of disc disease [286381]  Admitting Physician: Norval Morton [[id number]]  Attending Physician: Norval Morton [[id number]]  PT Class (Do Not Modify): Observation [104]  PT Acc Code (Do Not Modify): Observation [10022]       Medical History Past Medical History:  Diagnosis Date  . Blood type, Rh negative 01/23/09  . Cystocele 02/13/09  . GI bleed   . H/O emphysema   . H/O measles 01/23/09  . H/O uterine prolapse 01/23/09  . Hx of varicella 01/23/09  . Hypertension 01/23/09  . Pelvic relaxation 01/23/09  . Thyroid cyst   . Yeast infection     Allergies No Known Allergies  IV Location/Drains/Wounds Patient Lines/Drains/Airways Status   Active Line/Drains/Airways    Name:   Placement date:   Placement time:   Site:   Days:   Peripheral IV 04/05/17 Left;Posterior Forearm   04/05/17    2308    Forearm   1          Labs/Imaging Results for orders placed or performed during the hospital encounter of 04/05/17 (from the past 48 hour(s))  CBC with Differential     Status: Abnormal   Collection Time: 04/05/17 11:01 PM  Result Value Ref Range   WBC 15.4 (H) 4.0 - 10.5 K/uL   RBC 4.46 3.87 - 5.11 MIL/uL   Hemoglobin 12.6 12.0 - 15.0 g/dL   HCT 39.1 36.0 - 46.0 %   MCV 87.7 78.0 -  100.0 fL   MCH 28.3 26.0 - 34.0 pg   MCHC 32.2 30.0 - 36.0 g/dL   RDW 16.8 (H) 11.5 - 15.5 %   Platelets 301 150 - 400 K/uL   Neutrophils Relative % 79 %   Neutro Abs 12.4 (H) 1.7 - 7.7 K/uL   Lymphocytes Relative 6 %   Lymphs Abs 0.9 0.7 - 4.0 K/uL   Monocytes Relative 14 %   Monocytes Absolute 2.1 (H) 0.1 - 1.0 K/uL   Eosinophils Relative 1 %   Eosinophils Absolute 0.1 0.0 - 0.7 K/uL   Basophils Relative 0 %   Basophils Absolute 0.0 0.0 - 0.1 K/uL    Comment: Performed at [facility], [place] [address], [place], [place] [address]  Basic metabolic panel     Status: Abnormal   Collection Time: 04/05/17 11:01 PM  Result Value Ref Range   Sodium 139 135 - 145 mmol/L   Potassium 3.5 3.5 - 5.1 mmol/L   Chloride 101 101 - 111 mmol/L   CO2 23 22 - 32 mmol/L   Glucose, Bld 119 (H) 65 - 99 mg/dL   BUN 16 6 - 20 mg/dL   Creatinine, Ser 0.57 0.44 - 1.00  mg/dL   Calcium 9.8 8.9 - 10.3 mg/dL   GFR calc non Af Amer >60 >60 mL/min   GFR calc Af Amer >60 >60 mL/min    Comment: (NOTE) The eGFR has been calculated using the CKD EPI equation. This calculation has not been validated in all clinical situations. eGFR's persistently <60 mL/min signify possible Chronic Kidney Disease.    Anion gap 15 5 - 15    Comment: Performed at [facility], [place] [address] [place], [place] [address]  Urinalysis, Routine w reflex microscopic     Status: Abnormal   Collection Time: 04/06/17 12:45 AM  Result Value Ref Range   Color, Urine YELLOW YELLOW   APPearance CLEAR CLEAR   Specific Gravity, Urine 1.025 1.005 - 1.030   pH 7.0 5.0 - 8.0   Glucose, UA NEGATIVE NEGATIVE mg/dL   Hgb urine dipstick NEGATIVE NEGATIVE   Bilirubin Urine NEGATIVE NEGATIVE   Ketones, ur NEGATIVE NEGATIVE mg/dL   Protein, ur NEGATIVE NEGATIVE mg/dL   Nitrite NEGATIVE NEGATIVE   Leukocytes, UA SMALL (A) NEGATIVE   RBC / HPF 0-5 0 - 5 RBC/hpf   WBC, UA 6-30 0 - 5 WBC/hpf   Bacteria, UA RARE  (A) NONE SEEN   Squamous Epithelial / LPF 0-5 (A) NONE SEEN   Hyaline Casts, UA PRESENT     Comment: Performed at [facility], [place] [address] [place], [place] [address]   Dg Chest 2 View  Result Date: 04/06/2017 CLINICAL DATA:  84 year old female with weakness, head and neck pain. EXAM: CHEST  2 VIEW COMPARISON:  01/27/2015 and earlier. FINDINGS: Semi upright AP and lateral views of the chest. Moderate to large gas and fluid containing gastric hiatal hernia. Hernia size not significantly changed compared to 2016. Other mediastinal contours are within normal limits. Visualized tracheal air column is within normal limits. No pneumothorax, pulmonary edema, pleural effusion or confluent pulmonary opacity. Negative visible bowel gas pattern in the abdomen. Degenerative changes in the spine and at both shoulders. No acute osseous abnormality identified. IMPRESSION: 1.  No acute cardiopulmonary abnormality. 2. Moderate to large chronic gastric hiatal hernia has not significantly changed since 2016. Electronically Signed   By: Genevie Ann M.D.   On: 04/06/2017 00:14   Ct Head Wo Contrast  Result Date: 04/06/2017 CLINICAL DATA:  Head and neck pain. Leukocytosis. History of hypertension, thyroid cyst. EXAM: CT HEAD WITHOUT CONTRAST CT NECK WITH CONTRAST TECHNIQUE: Contiguous axial images were obtained from the base of the skull through the vertex without intravenous contrast. Multidetector CT imaging of the and neck was performed using the standard protocol following the bolus administration of intravenous contrast. CONTRAST:  75 cc Isovue-300 COMPARISON:  CT HEAD March 08, 2017 FINDINGS: CT HEAD FINDINGS BRAIN: No intraparenchymal hemorrhage, mass effect nor midline shift. The ventricles and sulci are normal for age. Patchy supratentorial white matter hypodensities within normal range for patient's age, though non-specific are most compatible with chronic small vessel ischemic disease. Small  area RIGHT inferior frontal lobe encephalomalacia. No acute large vascular territory infarcts. No abnormal extra-axial fluid collections. Basal cisterns are patent. VASCULAR: Severe calcific atherosclerosis of the carotid siphons. SKULL: No skull fracture. No significant scalp soft tissue swelling. SINUSES/ORBITS: Scattered mucosal retention cyst. Included ocular globes and orbital contents are non-suspicious. Old LEFT medial orbital blowout fracture. Status post bilateral ocular lens implants. OTHER: None. CT NECK FINDINGS PHARYNX AND LARYNX: Normal.  Widely patent airway. SALIVARY GLANDS: Severely atrophic LEFT submandibular gland with  5 mm LEFT submandibular sialolith, 3 mm sialolith LEFT submandibular duct. THYROID: 8 mm LEFT thyroid nodule, below size follow-up evaluation, otherwise unremarkable. LYMPH NODES: No lymphadenopathy by CT size criteria. VASCULAR: Mild calcific atherosclerosis aortic arch. Moderate calcific atherosclerosis bilateral carotid bifurcations, not tailored for assessment of potentially hemodynamically significant stenosis. SKELETON: Grade 1 C4-5 and C5-6 anterolisthesis on degenerative basis. Severe atlantodental osteoarthrosis with subchondral cysts and mild basilar invagination. Associated large amount of pannus. Multilevel severe degenerative discs. No destructive bony lesions. Severe C3-4 through C6-7 neural foraminal narrowing. UPPER CHEST: Severe centrilobular emphysema. No mediastinal lymphadenopathy and included chest. OTHER: None. IMPRESSION: CT HEAD: 1. No acute intracranial process. 2. Moderate chronic small vessel ischemic disease. RIGHT frontal encephalomalacia is likely posttraumatic. CT NECK: 1. No acute process in the neck. 2. LEFT submandibular sialolith with severe chronic sialadenosis. 3. Severe C1-2 osteoarthrosis associated with large volume pannus and mild basilar invagination associated with inflammatory arthropathy/CPPD. 4. Grade 1 C4-5 and C5-6 anterolisthesis on  degenerative basis. No fracture. 5. Severe C3-4 through C6-7 neural foraminal narrowing. Aortic Atherosclerosis (ICD10-I70.0) and Emphysema (ICD10-J43.9). Electronically Signed   By: Elon Alas M.D.   On: 04/06/2017 00:28   Ct Soft Tissue Neck W Contrast  Result Date: 04/06/2017 CLINICAL DATA:  Head and neck pain. Leukocytosis. History of hypertension, thyroid cyst. EXAM: CT HEAD WITHOUT CONTRAST CT NECK WITH CONTRAST TECHNIQUE: Contiguous axial images were obtained from the base of the skull through the vertex without intravenous contrast. Multidetector CT imaging of the and neck was performed using the standard protocol following the bolus administration of intravenous contrast. CONTRAST:  75 cc Isovue-300 COMPARISON:  CT HEAD March 08, 2017 FINDINGS: CT HEAD FINDINGS BRAIN: No intraparenchymal hemorrhage, mass effect nor midline shift. The ventricles and sulci are normal for age. Patchy supratentorial white matter hypodensities within normal range for patient's age, though non-specific are most compatible with chronic small vessel ischemic disease. Small area RIGHT inferior frontal lobe encephalomalacia. No acute large vascular territory infarcts. No abnormal extra-axial fluid collections. Basal cisterns are patent. VASCULAR: Severe calcific atherosclerosis of the carotid siphons. SKULL: No skull fracture. No significant scalp soft tissue swelling. SINUSES/ORBITS: Scattered mucosal retention cyst. Included ocular globes and orbital contents are non-suspicious. Old LEFT medial orbital blowout fracture. Status post bilateral ocular lens implants. OTHER: None. CT NECK FINDINGS PHARYNX AND LARYNX: Normal.  Widely patent airway. SALIVARY GLANDS: Severely atrophic LEFT submandibular gland with 5 mm LEFT submandibular sialolith, 3 mm sialolith LEFT submandibular duct. THYROID: 8 mm LEFT thyroid nodule, below size follow-up evaluation, otherwise unremarkable. LYMPH NODES: No lymphadenopathy by CT size  criteria. VASCULAR: Mild calcific atherosclerosis aortic arch. Moderate calcific atherosclerosis bilateral carotid bifurcations, not tailored for assessment of potentially hemodynamically significant stenosis. SKELETON: Grade 1 C4-5 and C5-6 anterolisthesis on degenerative basis. Severe atlantodental osteoarthrosis with subchondral cysts and mild basilar invagination. Associated large amount of pannus. Multilevel severe degenerative discs. No destructive bony lesions. Severe C3-4 through C6-7 neural foraminal narrowing. UPPER CHEST: Severe centrilobular emphysema. No mediastinal lymphadenopathy and included chest. OTHER: None. IMPRESSION: CT HEAD: 1. No acute intracranial process. 2. Moderate chronic small vessel ischemic disease. RIGHT frontal encephalomalacia is likely posttraumatic. CT NECK: 1. No acute process in the neck. 2. LEFT submandibular sialolith with severe chronic sialadenosis. 3. Severe C1-2 osteoarthrosis associated with large volume pannus and mild basilar invagination associated with inflammatory arthropathy/CPPD. 4. Grade 1 C4-5 and C5-6 anterolisthesis on degenerative basis. No fracture. 5. Severe C3-4 through C6-7 neural foraminal narrowing. Aortic Atherosclerosis (ICD10-I70.0) and Emphysema (  ICD10-J43.9). Electronically Signed   By: Elon Alas M.D.   On: 04/06/2017 00:28    Pending Labs Unresulted Labs (From admission, onward)   Start     Ordered   04/07/17 0500  CBC  Tomorrow morning,   R     04/06/17 0540   04/07/17 0355  Basic metabolic panel  Tomorrow morning,   R     04/06/17 0540   04/06/17 0124  Blood culture (routine x 2)  BLOOD CULTURE X 2,   STAT     04/06/17 0123   04/06/17 0114  Urine culture  STAT,   STAT    Question:  Patient immune status  Answer:  Normal   04/06/17 0113      Vitals/Pain Today's Vitals   04/06/17 0115 04/06/17 0145 04/06/17 0152 04/06/17 0153  BP:   (!) 150/88   Pulse: (!) 110 (!) 107 (!) 105   Resp: 18 (!) 21 17   Temp:       TempSrc:      SpO2: 98% 97% 97%   Weight:      Height:      PainSc:    3     Isolation Precautions No active isolations  Medications Medications  lidocaine (LIDODERM) 5 % 2 patch (2 patches Transdermal Patch Applied 04/05/17 2317)  iopamidol (ISOVUE-300) 61 % injection (not administered)  HYDROcodone-acetaminophen (NORCO/VICODIN) 5-325 MG per tablet 1 tablet (not administered)  lidocaine (LIDODERM) 5 % 2 patch (not administered)  amLODipine (NORVASC) tablet 10 mg (not administered)  enoxaparin (LOVENOX) injection 30 mg (not administered)  ondansetron (ZOFRAN) tablet 4 mg (not administered)    Or  ondansetron (ZOFRAN) injection 4 mg (not administered)  acetaminophen (TYLENOL) tablet 650 mg (not administered)    Or  acetaminophen (TYLENOL) suppository 650 mg (not administered)  cefTRIAXone (ROCEPHIN) 1 g in sodium chloride 0.9 % 100 mL IVPB (not administered)  0.9 %  sodium chloride infusion (not administered)  fentaNYL (SUBLIMAZE) injection 25 mcg (25 mcg Intravenous Given 04/05/17 2321)  ketorolac (TORADOL) 15 MG/ML injection 15 mg (15 mg Intravenous Given 04/05/17 2319)  sodium chloride 0.9 % bolus 1,000 mL (0 mLs Intravenous Stopped 04/06/17 0054)  iopamidol (ISOVUE-300) 61 % injection 75 mL (75 mLs Intravenous Contrast Given 04/05/17 2344)  dexamethasone (DECADRON) injection 10 mg (10 mg Intravenous Given 04/06/17 0104)  acetaminophen (TYLENOL) tablet 1,000 mg (1,000 mg Oral Given 04/06/17 0104)  traMADol (ULTRAM) tablet 50 mg (50 mg Oral Given 04/06/17 0112)  cefTRIAXone (ROCEPHIN) 1 g in sodium chloride 0.9 % 100 mL IVPB (0 g Intravenous Stopped 04/06/17 0219)  0.9 %  sodium chloride infusion ( Intravenous Transfusing/Transfer 04/06/17 0633)  HYDROcodone-acetaminophen (NORCO/VICODIN) 5-325 MG per tablet 1 tablet (1 tablet Oral Given 04/06/17 9741)    Mobility walks

## 2017-04-06 NOTE — H&P (Signed)
History and Physical    Shannon Riggs OZH:[id number] DOB: May 03, 1925 DOA: 04/05/2017  Referring MD/NP/PA: Dr. Shaune Pollack PCP: Dorothyann Peng, MD  Patient coming from: Home  Chief Complaint: Neck pain  I have personally briefly reviewed patient's old medical records in [facility]   HPI: Shannon Riggs is a 84 y.o. female with medical history significant of HTN, uterine prolapse, and mild dementia; who presents with complaints of progressively worsening posterior neck pain over the last 2-3 days.  History is obtained from the patient and her grandson.  She was noted to have last fallen 2 weeks ago, but family does not report any other recent trauma.  She describes the pain as a soreness that is constant. Any movement of her neck worsens the pain.  Associated symptoms include headache, insomnia, generalized weakness, dysuria, decreased oral intake, and constipation.  Denies having any significant weakness in her legs or arms, fever, chills, change in vision, nausea, or vomiting,.  Tried utilizing Flexeril without any relief of symptoms.  At baseline the patient ambulates without need of assistance and her grandson has recently taken over her care in the last month and is her POA. She had been evaluated in the ED 1 month ago with reports of an intractable posterior headache, but was given Tylenol and able to be discharged home.  ED Course: On admission into the emergency department patient was noted to be afebrile, pulse 104-125, respirations 17-22, blood pressures 150/88-181/92, and O2 saturation maintained on room air.  Labs revealed WBC 15.4 and urinalysis with small leukocytes rare bacteria and 6-30 WBCs.  Patient was given empiric antibiotics of Rocephin.  CT scan of the head and cervical spine showed severe osteoarthritis of C1-2.  Neurosurgery was consulted and recommended giving steroids as patient likely not a surgical candidate.  Patient was given 10 mg of Decadron IV, lidocaine  patch, and fentanyl for pain.  TRH called to admit.  Review of Systems  Constitutional: Positive for malaise/fatigue. Negative for chills and fever.  HENT: Negative for ear discharge and nosebleeds.   Eyes: Negative for pain and discharge.  Respiratory: Negative for cough and shortness of breath.   Cardiovascular: Negative for chest pain and leg swelling.  Gastrointestinal: Positive for constipation. Negative for diarrhea, nausea and vomiting.  Genitourinary: Positive for dysuria. Negative for flank pain.  Musculoskeletal: Positive for falls and neck pain.  Skin: Negative for itching and rash.  Neurological: Positive for weakness. Negative for focal weakness and loss of consciousness.  Psychiatric/Behavioral: Positive for memory loss. Negative for substance abuse.    Past Medical History:  Diagnosis Date  . Blood type, Rh negative 01/23/09  . Cystocele 02/13/09  . GI bleed   . H/O emphysema   . H/O measles 01/23/09  . H/O uterine prolapse 01/23/09  . Hx of varicella 01/23/09  . Hypertension 01/23/09  . Pelvic relaxation 01/23/09  . Thyroid cyst   . Yeast infection     Past Surgical History:  Procedure Laterality Date  . ESOPHAGOGASTRODUODENOSCOPY N/A 10/25/2012   Procedure: ESOPHAGOGASTRODUODENOSCOPY (EGD);  Surgeon: Charna Vilma, MD;  Location: [facility] ENDOSCOPY;  Service: Endoscopy;  Laterality: N/A;     reports that she quit smoking about 38 years ago. Her smoking use included cigarettes. She smoked 0.00 packs per day. she has never used smokeless tobacco. She reports that she does not drink alcohol or use drugs.  No Known Allergies  No family history on file.  Prior to Admission medications   Medication Sig  Start Date End Date Taking? Authorizing Provider  amLODipine (NORVASC) 10 MG tablet Take 10 mg by mouth daily. 12/27/14  Yes [provider]  lisinopril-hydrochlorothiazide (PRINZIDE,ZESTORETIC) 20-12.5 MG tablet Take 1 tablet by mouth daily. 01/19/15  Yes [provider]  ferrous sulfate 325 (65 FE) MG tablet Take 1 tablet (325 mg total) by mouth daily with breakfast. Patient not taking: Reported on 09/11/2015 10/27/12   Cleora Fleet, MD  mirtazapine (REMERON) 15 MG tablet Take 1 tablet (15 mg total) by mouth at bedtime. Patient not taking: Reported on 04/05/2017 08/15/13   Vanetta Mulders, MD    Physical Exam:  Constitutional: Elderly female in significant discomfort with any head movement. Vitals:   04/06/17 0104 04/06/17 0115 04/06/17 0145 04/06/17 0152  BP: (!) 159/82   (!) 150/88  Pulse: (!) 108 (!) 110 (!) 107 (!) 105  Resp: 17 18 (!) 21 17  Temp:      TempSrc:      SpO2: 93% 98% 97% 97%  Weight:      Height:       Eyes: PERRL, lids and conjunctivae normal ENMT: Mucous membranes are moist. Posterior pharynx clear of any exudate or lesions. edentulous Neck: Tenderness palpation of the posterior cervical spine with decreased overall range of motion. Respiratory: clear to auscultation bilaterally, no wheezing, no crackles. Normal respiratory effort. No accessory muscle use.  Cardiovascular: Tachycardic, no murmurs / rubs / gallops. No extremity edema. 2+ pedal pulses. No carotid bruits.  Abdomen: no tenderness, no masses palpated. No hepatosplenomegaly. Bowel sounds positive.  Musculoskeletal: no clubbing / cyanosis.  Tenderness to palpation of the cervical spine as seen above. Skin: no rashes, lesions, ulcers. No induration Neurologic: CN 2-12 grossly intact. Sensation intact, DTR normal. Strength 5/5 in all 4.  Psychiatric: Normal judgment and insight. Alert and oriented x 3. Normal mood.     Labs on Admission: I have personally reviewed following labs and imaging studies  CBC: Recent Labs  Lab 04/05/17 2301  WBC 15.4*  NEUTROABS 12.4*  HGB 12.6  HCT 39.1  MCV 87.7  PLT 301   Basic Metabolic Panel: Recent Labs  Lab 04/05/17 2301  NA 139  K 3.5  CL 101  CO2 23  GLUCOSE 119*  BUN 16  CREATININE 0.57    CALCIUM 9.8   GFR: Estimated Creatinine Clearance: 23.8 mL/min (by C-G formula based on SCr of 0.57 mg/dL). Liver Function Tests: No results for input(s): AST, ALT, ALKPHOS, BILITOT, PROT, ALBUMIN in the last 168 hours. No results for input(s): LIPASE, AMYLASE in the last 168 hours. No results for input(s): AMMONIA in the last 168 hours. Coagulation Profile: No results for input(s): INR, PROTIME in the last 168 hours. Cardiac Enzymes: No results for input(s): CKTOTAL, CKMB, CKMBINDEX, TROPONINI in the last 168 hours. BNP (last 3 results) No results for input(s): PROBNP in the last 8760 hours. HbA1C: No results for input(s): HGBA1C in the last 72 hours. CBG: No results for input(s): GLUCAP in the last 168 hours. Lipid Profile: No results for input(s): CHOL, HDL, LDLCALC, TRIG, CHOLHDL, LDLDIRECT in the last 72 hours. Thyroid Function Tests: No results for input(s): TSH, T4TOTAL, FREET4, T3FREE, THYROIDAB in the last 72 hours. Anemia Panel: No results for input(s): VITAMINB12, FOLATE, FERRITIN, TIBC, IRON, RETICCTPCT in the last 72 hours. Urine analysis:    Component Value Date/Time   COLORURINE YELLOW 04/06/2017 0045   APPEARANCEUR CLEAR 04/06/2017 0045   LABSPEC 1.025 04/06/2017 0045   PHURINE 7.0 04/06/2017  0045   GLUCOSEU NEGATIVE 04/06/2017 0045   HGBUR NEGATIVE 04/06/2017 0045   BILIRUBINUR NEGATIVE 04/06/2017 0045   KETONESUR NEGATIVE 04/06/2017 0045   PROTEINUR NEGATIVE 04/06/2017 0045   UROBILINOGEN 1.0 08/15/2013 1409   NITRITE NEGATIVE 04/06/2017 0045   LEUKOCYTESUR SMALL (A) 04/06/2017 0045   Sepsis Labs: No results found for this or any previous visit (from the past 240 hour(s)).   Radiological Exams on Admission: Dg Chest 2 View  Result Date: 04/06/2017 CLINICAL DATA:  84 year old female with weakness, head and neck pain. EXAM: CHEST  2 VIEW COMPARISON:  01/27/2015 and earlier. FINDINGS: Semi upright AP and lateral views of the chest. Moderate to large gas  and fluid containing gastric hiatal hernia. Hernia size not significantly changed compared to 2016. Other mediastinal contours are within normal limits. Visualized tracheal air column is within normal limits. No pneumothorax, pulmonary edema, pleural effusion or confluent pulmonary opacity. Negative visible bowel gas pattern in the abdomen. Degenerative changes in the spine and at both shoulders. No acute osseous abnormality identified. IMPRESSION: 1.  No acute cardiopulmonary abnormality. 2. Moderate to large chronic gastric hiatal hernia has not significantly changed since 2016. Electronically Signed   By: Odessa Fleming M.D.   On: 04/06/2017 00:14   Ct Head Wo Contrast  Result Date: 04/06/2017 CLINICAL DATA:  Head and neck pain. Leukocytosis. History of hypertension, thyroid cyst. EXAM: CT HEAD WITHOUT CONTRAST CT NECK WITH CONTRAST TECHNIQUE: Contiguous axial images were obtained from the base of the skull through the vertex without intravenous contrast. Multidetector CT imaging of the and neck was performed using the standard protocol following the bolus administration of intravenous contrast. CONTRAST:  75 cc Isovue-300 COMPARISON:  CT HEAD March 08, 2017 FINDINGS: CT HEAD FINDINGS BRAIN: No intraparenchymal hemorrhage, mass effect nor midline shift. The ventricles and sulci are normal for age. Patchy supratentorial white matter hypodensities within normal range for patient's age, though non-specific are most compatible with chronic small vessel ischemic disease. Small area RIGHT inferior frontal lobe encephalomalacia. No acute large vascular territory infarcts. No abnormal extra-axial fluid collections. Basal cisterns are patent. VASCULAR: Severe calcific atherosclerosis of the carotid siphons. SKULL: No skull fracture. No significant scalp soft tissue swelling. SINUSES/ORBITS: Scattered mucosal retention cyst. Included ocular globes and orbital contents are non-suspicious. Old LEFT medial orbital blowout  fracture. Status post bilateral ocular lens implants. OTHER: None. CT NECK FINDINGS PHARYNX AND LARYNX: Normal.  Widely patent airway. SALIVARY GLANDS: Severely atrophic LEFT submandibular gland with 5 mm LEFT submandibular sialolith, 3 mm sialolith LEFT submandibular duct. THYROID: 8 mm LEFT thyroid nodule, below size follow-up evaluation, otherwise unremarkable. LYMPH NODES: No lymphadenopathy by CT size criteria. VASCULAR: Mild calcific atherosclerosis aortic arch. Moderate calcific atherosclerosis bilateral carotid bifurcations, not tailored for assessment of potentially hemodynamically significant stenosis. SKELETON: Grade 1 C4-5 and C5-6 anterolisthesis on degenerative basis. Severe atlantodental osteoarthrosis with subchondral cysts and mild basilar invagination. Associated large amount of pannus. Multilevel severe degenerative discs. No destructive bony lesions. Severe C3-4 through C6-7 neural foraminal narrowing. UPPER CHEST: Severe centrilobular emphysema. No mediastinal lymphadenopathy and included chest. OTHER: None. IMPRESSION: CT HEAD: 1. No acute intracranial process. 2. Moderate chronic small vessel ischemic disease. RIGHT frontal encephalomalacia is likely posttraumatic. CT NECK: 1. No acute process in the neck. 2. LEFT submandibular sialolith with severe chronic sialadenosis. 3. Severe C1-2 osteoarthrosis associated with large volume pannus and mild basilar invagination associated with inflammatory arthropathy/CPPD. 4. Grade 1 C4-5 and C5-6 anterolisthesis on degenerative basis. No fracture. 5. Severe  C3-4 through C6-7 neural foraminal narrowing. Aortic Atherosclerosis (ICD10-I70.0) and Emphysema (ICD10-J43.9). Electronically Signed   By: Awilda Metro M.D.   On: 04/06/2017 00:28   Ct Soft Tissue Neck W Contrast  Result Date: 04/06/2017 CLINICAL DATA:  Head and neck pain. Leukocytosis. History of hypertension, thyroid cyst. EXAM: CT HEAD WITHOUT CONTRAST CT NECK WITH CONTRAST TECHNIQUE:  Contiguous axial images were obtained from the base of the skull through the vertex without intravenous contrast. Multidetector CT imaging of the and neck was performed using the standard protocol following the bolus administration of intravenous contrast. CONTRAST:  75 cc Isovue-300 COMPARISON:  CT HEAD March 08, 2017 FINDINGS: CT HEAD FINDINGS BRAIN: No intraparenchymal hemorrhage, mass effect nor midline shift. The ventricles and sulci are normal for age. Patchy supratentorial white matter hypodensities within normal range for patient's age, though non-specific are most compatible with chronic small vessel ischemic disease. Small area RIGHT inferior frontal lobe encephalomalacia. No acute large vascular territory infarcts. No abnormal extra-axial fluid collections. Basal cisterns are patent. VASCULAR: Severe calcific atherosclerosis of the carotid siphons. SKULL: No skull fracture. No significant scalp soft tissue swelling. SINUSES/ORBITS: Scattered mucosal retention cyst. Included ocular globes and orbital contents are non-suspicious. Old LEFT medial orbital blowout fracture. Status post bilateral ocular lens implants. OTHER: None. CT NECK FINDINGS PHARYNX AND LARYNX: Normal.  Widely patent airway. SALIVARY GLANDS: Severely atrophic LEFT submandibular gland with 5 mm LEFT submandibular sialolith, 3 mm sialolith LEFT submandibular duct. THYROID: 8 mm LEFT thyroid nodule, below size follow-up evaluation, otherwise unremarkable. LYMPH NODES: No lymphadenopathy by CT size criteria. VASCULAR: Mild calcific atherosclerosis aortic arch. Moderate calcific atherosclerosis bilateral carotid bifurcations, not tailored for assessment of potentially hemodynamically significant stenosis. SKELETON: Grade 1 C4-5 and C5-6 anterolisthesis on degenerative basis. Severe atlantodental osteoarthrosis with subchondral cysts and mild basilar invagination. Associated large amount of pannus. Multilevel severe degenerative discs. No  destructive bony lesions. Severe C3-4 through C6-7 neural foraminal narrowing. UPPER CHEST: Severe centrilobular emphysema. No mediastinal lymphadenopathy and included chest. OTHER: None. IMPRESSION: CT HEAD: 1. No acute intracranial process. 2. Moderate chronic small vessel ischemic disease. RIGHT frontal encephalomalacia is likely posttraumatic. CT NECK: 1. No acute process in the neck. 2. LEFT submandibular sialolith with severe chronic sialadenosis. 3. Severe C1-2 osteoarthrosis associated with large volume pannus and mild basilar invagination associated with inflammatory arthropathy/CPPD. 4. Grade 1 C4-5 and C5-6 anterolisthesis on degenerative basis. No fracture. 5. Severe C3-4 through C6-7 neural foraminal narrowing. Aortic Atherosclerosis (ICD10-I70.0) and Emphysema (ICD10-J43.9). Electronically Signed   By: Awilda Metro M.D.   On: 04/06/2017 00:28    EKG: Independently reviewed.  Sinus tachycardia  Assessment/Plan Cervical neck pain with evidence of disc disease: Acute.  Patient noted to have severe C1-C2 osteoarthritis with inflammatory arthropathy.  Neurosurgery was consulted and recommended treating patient with steroids such as Medrol Dosepak as she was not a surgical candidate.  Patient was given 10 mg of Decadron overnight. - Admit to a MedSurg bed - Continue Lidoderm patch  - Hydrocodone prn pain  - Will need to be placed on Medrol Dosepak per recommendation from neurosurgery  Abnormal urinalysis/ suspected urinary tract infection: Patient reports having complaints of dysuria.  Urinalysis noted to be abnormal and was given IV antibiotics of Rocephin.  Patient with previous history of uterine prolapse and pessary placement - Follow-up urine culture  - Continue Rocephin - May warrant further evaluation of patient's symptoms and/or pelvic exam.  Leukocytosis: WBC elevated at 15.4.  Question the possibility of underlying infection  and/or inflammatory reaction related to neck.  -  Recheck CBC   Essential hypertension - Continue amlodipine - Held lisinopril hydrochlorothiazide question of possibility of dehydration because of weakness  Generalized weakness: Patient noted to have recent fall 2 weeks ago.  Family has concerns with stability and possible need of assistive aids. - Physical therapy to eval and treat - Care management consult for home health needs and/or equipment  DVT prophylaxis: lovenox   Code Status: Full Family Communication: Discussed plan of care with patient and family present at bedside. Disposition Plan: TBD  Consults called: none Admission status: inpatient   Clydie Braunondell A Smith MD [facility] Pager [phone]   If 7PM-7AM, please contact night-coverage [email or web address] Password [email or web address]  04/06/2017, 5:18 AM

## 2017-04-06 NOTE — ED Notes (Signed)
Admitting dr at bedside.  

## 2017-04-06 NOTE — Progress Notes (Signed)
Pt seen and examined at bedside, admitted after midnight. Continue IV Steroids for now and taper down as clinically indication.  Debbora PrestoMAGICK- Ducre, MD  [facility] Pager [phone]  If 7PM-7AM, please contact night-coverage [email or web address] Password [email or web address]

## 2017-04-06 NOTE — Care Management Obs Status (Signed)
MEDICARE OBSERVATION STATUS NOTIFICATION   Patient Details  Name: Broadus Johnlizabeth J Cromwell MRN: [id number] Date of Birth: [birth year]   Medicare Observation Status Notification Given:  Yes    Alexis Goodelleele, Irby Fails K, RN 04/06/2017, 1:26 PM

## 2017-04-07 DIAGNOSIS — K5900 Constipation, unspecified: Secondary | ICD-10-CM | POA: Diagnosis present

## 2017-04-07 DIAGNOSIS — F0390 Unspecified dementia without behavioral disturbance: Secondary | ICD-10-CM | POA: Diagnosis present

## 2017-04-07 DIAGNOSIS — Z9181 History of falling: Secondary | ICD-10-CM | POA: Diagnosis not present

## 2017-04-07 DIAGNOSIS — Z681 Body mass index (BMI) 19 or less, adult: Secondary | ICD-10-CM | POA: Diagnosis not present

## 2017-04-07 DIAGNOSIS — M436 Torticollis: Secondary | ICD-10-CM | POA: Diagnosis present

## 2017-04-07 DIAGNOSIS — I10 Essential (primary) hypertension: Secondary | ICD-10-CM | POA: Diagnosis present

## 2017-04-07 DIAGNOSIS — N390 Urinary tract infection, site not specified: Secondary | ICD-10-CM | POA: Diagnosis present

## 2017-04-07 DIAGNOSIS — R531 Weakness: Secondary | ICD-10-CM | POA: Diagnosis present

## 2017-04-07 DIAGNOSIS — R112 Nausea with vomiting, unspecified: Secondary | ICD-10-CM | POA: Diagnosis present

## 2017-04-07 DIAGNOSIS — Z87891 Personal history of nicotine dependence: Secondary | ICD-10-CM | POA: Diagnosis not present

## 2017-04-07 DIAGNOSIS — J439 Emphysema, unspecified: Secondary | ICD-10-CM | POA: Diagnosis present

## 2017-04-07 DIAGNOSIS — M4722 Other spondylosis with radiculopathy, cervical region: Secondary | ICD-10-CM | POA: Diagnosis present

## 2017-04-07 DIAGNOSIS — R636 Underweight: Secondary | ICD-10-CM | POA: Diagnosis present

## 2017-04-07 DIAGNOSIS — Z79899 Other long term (current) drug therapy: Secondary | ICD-10-CM | POA: Diagnosis not present

## 2017-04-07 DIAGNOSIS — M5090 Cervical disc disorder, unspecified, unspecified cervical region: Secondary | ICD-10-CM | POA: Diagnosis not present

## 2017-04-07 LAB — BASIC METABOLIC PANEL
Anion gap: 11 (ref 5–15)
BUN: 23 mg/dL — AB (ref 6–20)
CALCIUM: 9.2 mg/dL (ref 8.9–10.3)
CHLORIDE: 111 mmol/L (ref 101–111)
CO2: 20 mmol/L — ABNORMAL LOW (ref 22–32)
Creatinine, Ser: 0.65 mg/dL (ref 0.44–1.00)
GFR calc Af Amer: >60 mL/min (ref >60)
GFR calc non Af Amer: >60 mL/min (ref >60)
Glucose, Bld: 171 mg/dL — ABNORMAL HIGH (ref 65–99)
Potassium: 3.7 mmol/L (ref 3.5–5.1)
SODIUM: 142 mmol/L (ref 135–145)

## 2017-04-07 LAB — URINE CULTURE: Special Requests: NORMAL

## 2017-04-07 LAB — CBC
HCT: 31.4 % — ABNORMAL LOW (ref 36.0–46.0)
HEMOGLOBIN: 10 g/dL — AB (ref 12.0–15.0)
MCH: 28 pg (ref 26.0–34.0)
MCHC: 31.8 g/dL (ref 30.0–36.0)
MCV: 88 fL (ref 78.0–100.0)
Platelets: 290 10*3/uL (ref 150–400)
RBC: 3.57 MIL/uL — ABNORMAL LOW (ref 3.87–5.11)
RDW: 16.9 % — AB (ref 11.5–15.5)
WBC: 11.5 10*3/uL — ABNORMAL HIGH (ref 4.0–10.5)

## 2017-04-07 MED ORDER — POLYETHYLENE GLYCOL 3350 17 G PO PACK
17.0000 g | PACK | Freq: Every day | ORAL | Status: DC | PRN
  Administered 2017-04-08: 10:00:00 17 g via ORAL
  Filled 2017-04-07: qty 1

## 2017-04-07 MED ORDER — SENNOSIDES-DOCUSATE SODIUM 8.6-50 MG PO TABS
1.0000 | ORAL_TABLET | Freq: Two times a day (BID) | ORAL | Status: DC
  Administered 2017-04-07 – 2017-04-10 (×6): 1 via ORAL
  Filled 2017-04-07 (×6): qty 1

## 2017-04-07 MED ORDER — BISACODYL 10 MG RE SUPP
10.0000 mg | Freq: Every day | RECTAL | Status: DC | PRN
  Administered 2017-04-07 – 2017-04-08 (×2): 10 mg via RECTAL
  Filled 2017-04-07 (×2): qty 1

## 2017-04-07 MED ORDER — DEXAMETHASONE SODIUM PHOSPHATE 4 MG/ML IJ SOLN
4.0000 mg | Freq: Three times a day (TID) | INTRAMUSCULAR | Status: DC
  Administered 2017-04-07 – 2017-04-10 (×8): 4 mg via INTRAVENOUS
  Filled 2017-04-07 (×8): qty 1

## 2017-04-07 NOTE — Progress Notes (Addendum)
Patient ID: Shannon Riggs, female   DOB: 1925-12-26, 84 y.o.   MRN: [id number]    PROGRESS NOTE  ISMELDA WEATHERMAN  WGN:[id number] DOB: 1925/05/03 DOA: 04/05/2017  PCP: Dorothyann Peng, MD   Brief Narrative:  Pt is 84 yo female with HTN, dementia, presented with progressively worsening post neck pain 2-3 days in duration. Pt reports an episode of fall about two weeks ago but no other traumas. Pt has reported overall weakness and poor oral intake, dysuria. At baseline, pt ambulates independently.   ED Course: On admission into the emergency department patient was noted to be afebrile, pulse 104-125, respirations 17-22, blood pressures 150/88-181/92, and O2 saturation maintained on room air.  Labs revealed WBC 15.4 and urinalysis with small leukocytes rare bacteria and 6-30 WBCs.  Patient was given empiric antibiotics of Rocephin.  CT scan of the head and cervical spine showed severe osteoarthritis of C1-2.  Neurosurgery was consulted and recommended giving steroids as patient likely not a surgical candidate.  Patient was given 10 mg of Decadron IV, lidocaine patch, and fentanyl for pain.   Assessment & Plan:  Cervical neck pain with evidence of disc disease: Acute - severe C1-C2 OA with inflammatory arthropathy - per neurosurgery, place on steroids, still on Dexamethasone and pt says she feels better overall - will plan on transitioning to Medrol Dosepak in 24 hours as pt is no surgical candidate  - continue Lidoderm patch  - PT and OT eval pendign   Abnormal urinalysis/ suspected urinary tract infection - keep on Rocephin - follow up on urine cultures  - CBC in AM  Essential hypertension - continue Norvasc for now   Generalized weakness:  - Patient noted to have recent fall 2 weeks ago.  Family has concerns with stability and possible need of assistive aids. - PT and OT eval requested   Underweight  - Body mass index is 16.08 kg/m. - nutritionist consulted   DVT  prophylaxis: Lovenox SQ  Code Status: Full  Family Communication: Patient at bedside  Disposition Plan: to be determined   Consultants:   None  Procedures:   None   Antimicrobials:   Rocephin 02/13 -->  Subjective: Still with neck pain and radiating down to both shoulders R > L side.   Objective: Vitals:   04/06/17 1500 04/06/17 2117 04/07/17 0600 04/07/17 1000  BP: (!) 136/55 136/67 (!) 146/68 (!) 157/73  Pulse: 86 89 88 95  Resp: 15 15 15 16   Temp: 97.7 F (36.5 C) 97.7 F (36.5 C) 97.8 F (36.6 C) 97.6 F (36.4 C)  TempSrc: Oral Oral Oral Oral  SpO2: 99% 96% 93% 100%  Weight:      Height:        Intake/Output Summary (Last 24 hours) at 04/07/2017 1344 Last data filed at 04/07/2017 0923 Gross per 24 hour  Intake 1665 ml  Output 2 ml  Net 1663 ml   Filed Weights   04/05/17 2050 04/06/17 1123  Weight: 32.2 kg (71 lb) 34.9 kg (76 lb 15.1 oz)    Examination:  General exam: Appears calm and comfortable  Respiratory system: Clear to auscultation. Respiratory effort normal. Cardiovascular system: S1 & S2 heard, RRR. No rubs, gallops or clicks. No pedal edema. Gastrointestinal system: Abdomen is nondistended, soft and nontender. No organomegaly or masses felt. Normal bowel sounds heard. Central nervous system: alert, following commands, moving all 4 extremities spont  Skin: No rashes, lesions or ulcers Psychiatry: Judgement and insight appear normal. Mood & affect  appropriate.    Data Reviewed: I have personally reviewed following labs and imaging studies  CBC: Recent Labs  Lab 04/05/17 2301 04/07/17 0623  WBC 15.4* 11.5*  NEUTROABS 12.4*  --   HGB 12.6 10.0*  HCT 39.1 31.4*  MCV 87.7 88.0  PLT 301 290   Basic Metabolic Panel: Recent Labs  Lab 04/05/17 2301 04/07/17 0623  NA 139 142  K 3.5 3.7  CL 101 111  CO2 23 20*  GLUCOSE 119* 171*  BUN 16 23*  CREATININE 0.57 0.65  CALCIUM 9.8 9.2   Urinalysis:    Component Value Date/Time    COLORURINE YELLOW 04/06/2017 0045   APPEARANCEUR CLEAR 04/06/2017 0045   LABSPEC 1.025 04/06/2017 0045   PHURINE 7.0 04/06/2017 0045   GLUCOSEU NEGATIVE 04/06/2017 0045   HGBUR NEGATIVE 04/06/2017 0045   BILIRUBINUR NEGATIVE 04/06/2017 0045   KETONESUR NEGATIVE 04/06/2017 0045   PROTEINUR NEGATIVE 04/06/2017 0045   UROBILINOGEN 1.0 08/15/2013 1409   NITRITE NEGATIVE 04/06/2017 0045   LEUKOCYTESUR SMALL (A) 04/06/2017 0045    Recent Results (from the past 240 hour(s))  Urine culture     Status: Abnormal   Collection Time: 04/06/17  1:14 AM  Result Value Ref Range Status   Specimen Description   Final    URINE, CLEAN CATCH Performed at [facility], [address] [place], [place] [address]    Special Requests   Final    Normal Performed at [facility], [address] [place], [place] [address]    Culture MULTIPLE SPECIES PRESENT, SUGGEST RECOLLECTION (A)  Final   Report Status 04/07/2017 FINAL  Final    Radiology Studies: Dg Chest 2 View Result Date: 04/06/2017 No acute cardiopulmonary abnormality. 2. Moderate to large chronic gastric hiatal hernia has not significantly changed since 2016.   Ct Head Wo Contrast Result Date: 04/06/2017 1. No acute intracranial process. 2. Moderate chronic small vessel ischemic disease. RIGHT frontal encephalomalacia is likely posttraumatic. CT NECK: 1. No acute process in the neck. 2. LEFT submandibular sialolith with severe chronic sialadenosis. 3. Severe C1-2 osteoarthrosis associated with large volume pannus and mild basilar invagination associated with inflammatory arthropathy/CPPD. 4. Grade 1 C4-5 and C5-6 anterolisthesis on degenerative basis. No fracture. 5. Severe C3-4 through C6-7 neural foraminal narrowing.   Ct Soft Tissue Neck W Contrast Result Date: 04/06/2017 Grade 1 C4-5 and C5-6 anterolisthesis on degenerative basis. Severe atlantodental osteoarthrosis with subchondral cysts and mild basilar  invagination. Associated large amount of pannus. Multilevel severe degenerative discs. No destructive bony lesions. Severe C3-4 through C6-7 neural foraminal narrowing. UPPER CHEST: Severe centrilobular emphysema. No mediastinal lymphadenopathy and included chest. OTHER: None. IMPRESSION: CT HEAD: 1. No acute intracranial process. 2. Moderate chronic small vessel ischemic disease. RIGHT frontal encephalomalacia is likely posttraumatic. CT NECK: 1. No acute process in the neck. 2. LEFT submandibular sialolith with severe chronic sialadenosis. 3. Severe C1-2 osteoarthrosis associated with large volume pannus and mild basilar invagination associated with inflammatory arthropathy/CPPD. 4. Grade 1 C4-5 and C5-6 anterolisthesis on degenerative basis. No fracture. 5. Severe C3-4 through C6-7 neural foraminal narrowing. Aortic Atherosclerosis  Scheduled Meds: . amLODipine  10 mg Oral Daily  . dexamethasone  4 mg Intravenous Q6H  . enoxaparin (LOVENOX) injection  30 mg Subcutaneous Q24H  . lidocaine  2 patch Transdermal Q24H  . mouth rinse  15 mL Mouth Rinse BID  . senna-docusate  1 tablet Oral BID   Continuous Infusions: . sodium chloride 75 mL/hr at 04/07/17  1102  . cefTRIAXone (ROCEPHIN)  IV Stopped (04/06/17 2312)     LOS: 0 days   Time spent: 25 minutes   Debbora Presto, MD [facility] Pager [phone]  If 7PM-7AM, please contact night-coverage [email or web address] Password [employer] 04/07/2017, 1:44 PM

## 2017-04-07 NOTE — Progress Notes (Signed)
04/07/17 2000 Nursing Dr. Antionette Charpyd paged reg patient c/o of nausea and vomiting small amount of emesis. Patient's family member states it has been 1-2 weeks since patient has had good bm. Order received for suppository and miralax. Will continue to monitor patient.

## 2017-04-07 NOTE — Evaluation (Signed)
Physical Therapy Evaluation Patient Details Name: Shannon Riggs MRN: [id number] DOB: 25-Jan-1926 Today's Date: 04/07/2017   History of Present Illness  Pt admitted s/p fall and with torticollis and headache  Clinical Impression  Pt admitted as above and presenting with functional mobility limitations 2* generalized weakness and mild ambulatory balance deficits.  Pt should progress to dc home with family assist.    Follow Up Recommendations Home health PT    Equipment Recommendations  None recommended by PT    Recommendations for Other Services OT consult     Precautions / Restrictions Precautions Precautions: Fall Restrictions Weight Bearing Restrictions: No      Mobility  Bed Mobility Overal bed mobility: Modified Independent             General bed mobility comments: Pt unassisted to EOB sitting  Transfers Overall transfer level: Needs assistance Equipment used: None Transfers: Sit to/from Stand Sit to Stand: Min guard         General transfer comment: cues for safe transition position and use of UEs to self assist  Ambulation/Gait Ambulation/Gait assistance: Min guard;Supervision Ambulation Distance (Feet): 240 Feet Assistive device: Rolling walker (2 wheeled) Gait Pattern/deviations: Step-through pattern;Decreased step length - right;Decreased step length - left;Shuffle;Trunk flexed     General Gait Details: cues for posture and position from RW.  Increased stability noted with use of RW - pt reaching for furniture to steady when ambulating short distance sans AD  Stairs            Wheelchair Mobility    Modified Rankin (Stroke Patients Only)       Balance Overall balance assessment: Needs assistance Sitting-balance support: No upper extremity supported;Feet supported Sitting balance-Leahy Scale: Good     Standing balance support: No upper extremity supported Standing balance-Leahy Scale: Fair                                Pertinent Vitals/Pain Pain Assessment: No/denies pain    Home Living Family/patient expects to be discharged to:: Private residence Living Arrangements: Other relatives(Grand sons) Available Help at Discharge: Family(Pt unclear on availability) Type of Home: House Home Access: Stairs to enter Entrance Stairs-Rails: Doctor, general practiceight;Left Entrance Stairs-Number of Steps: 2 Home Layout: One level Home Equipment: None      Prior Function Level of Independence: Independent               Hand Dominance        Extremity/Trunk Assessment   Upper Extremity Assessment Upper Extremity Assessment: Generalized weakness    Lower Extremity Assessment Lower Extremity Assessment: Generalized weakness       Communication   Communication: No difficulties  Cognition Arousal/Alertness: Awake/alert Behavior During Therapy: WFL for tasks assessed/performed;Impulsive Overall Cognitive Status: No family/caregiver present to determine baseline cognitive functioning                                        General Comments      Exercises     Assessment/Plan    PT Assessment Patient needs continued PT services  PT Problem List Decreased strength;Decreased activity tolerance;Decreased balance;Decreased mobility;Decreased knowledge of use of DME       PT Treatment Interventions DME instruction;Gait training;Stair training;Functional mobility training;Therapeutic activities;Therapeutic exercise;Patient/family education    PT Goals (Current goals can be found in the Care Plan section)  Acute  Rehab PT Goals Patient Stated Goal: HOME` PT Goal Formulation: With patient Time For Goal Achievement: 04/21/17 Potential to Achieve Goals: Good    Frequency Min 3X/week   Barriers to discharge        Co-evaluation               AM-PAC PT "6 Clicks" Daily Activity  Outcome Measure Difficulty turning over in bed (including adjusting bedclothes, sheets and blankets)?:  A Little Difficulty moving from lying on back to sitting on the side of the bed? : A Little Difficulty sitting down on and standing up from a chair with arms (e.g., wheelchair, bedside commode, etc,.)?: A Little Help needed moving to and from a bed to chair (including a wheelchair)?: A Little Help needed walking in hospital room?: A Little Help needed climbing 3-5 steps with a railing? : A Little 6 Click Score: 18    End of Session Equipment Utilized During Treatment: Gait belt Activity Tolerance: Patient tolerated treatment well Patient left: in chair;with call bell/phone within reach;with chair alarm set Nurse Communication: Mobility status PT Visit Diagnosis: Unsteadiness on feet (R26.81);Difficulty in walking, not elsewhere classified (R26.2);Muscle weakness (generalized) (M62.81)    Time: 1610-9604 PT Time Calculation (min) (ACUTE ONLY): 20 min   Charges:   PT Evaluation $PT Eval Low Complexity: 1 Low     PT G Codes:        Pg [phone]   Shannon Riggs 04/07/2017, 4:39 PM

## 2017-04-08 LAB — BASIC METABOLIC PANEL
Anion gap: 9 (ref 5–15)
BUN: 23 mg/dL — AB (ref 6–20)
CHLORIDE: 110 mmol/L (ref 101–111)
CO2: 22 mmol/L (ref 22–32)
Calcium: 9.3 mg/dL (ref 8.9–10.3)
Creatinine, Ser: 0.59 mg/dL (ref 0.44–1.00)
GFR calc Af Amer: >60 mL/min (ref >60)
GFR calc non Af Amer: >60 mL/min (ref >60)
GLUCOSE: 159 mg/dL — AB (ref 65–99)
POTASSIUM: 3.5 mmol/L (ref 3.5–5.1)
Sodium: 141 mmol/L (ref 135–145)

## 2017-04-08 LAB — CBC
HEMATOCRIT: 30 % — AB (ref 36.0–46.0)
HEMOGLOBIN: 9.8 g/dL — AB (ref 12.0–15.0)
MCH: 28.6 pg (ref 26.0–34.0)
MCHC: 32.7 g/dL (ref 30.0–36.0)
MCV: 87.5 fL (ref 78.0–100.0)
Platelets: 281 10*3/uL (ref 150–400)
RBC: 3.43 MIL/uL — ABNORMAL LOW (ref 3.87–5.11)
RDW: 17 % — ABNORMAL HIGH (ref 11.5–15.5)
WBC: 11 10*3/uL — ABNORMAL HIGH (ref 4.0–10.5)

## 2017-04-08 MED ORDER — FLEET ENEMA 7-19 GM/118ML RE ENEM: 1.0000 | ENEMA | Freq: Every day | RECTAL | Status: DC | PRN

## 2017-04-08 MED ORDER — BOOST / RESOURCE BREEZE PO LIQD CUSTOM
1.0000 | Freq: Two times a day (BID) | ORAL | Status: DC
  Administered 2017-04-08 – 2017-04-10 (×4): 1 via ORAL

## 2017-04-08 NOTE — Progress Notes (Signed)
Initial Nutrition Assessment  DOCUMENTATION CODES:   Severe malnutrition in context of chronic illness, Underweight  INTERVENTION:  - Will order Boost Breeze BID, each supplement provides 250 kcal and 9 grams of protein - Continue to encourage PO intakes.  NUTRITION DIAGNOSIS:   Severe Malnutrition related to chronic illness(advanced age, dementia) as evidenced by severe fat depletion, severe muscle depletion.  GOAL:   Patient will meet greater than or equal to 90% of their needs  MONITOR:   PO intake, Supplement acceptance, Weight trends, Labs  REASON FOR ASSESSMENT:   Consult Assessment of nutrition requirement/status  ASSESSMENT:   84 yo female with HTN, dementia, presented with progressively worsening post neck pain 2-3 days in duration. Pt reports an episode of fall about two weeks ago but no other traumas. Pt has reported overall weakness and poor oral intake, dysuria. At baseline, pt ambulates independently.  BMI indicates underweight status. Per review of flow sheet and Health Touch, pt consumed 100% of all meals yesterday (1872 kcal, 80 grams of protein). Unsure if pt consumed all of the items or if visitors also ate off of her tray. Visualized breakfast tray this AM with ~50% completion (300 kcal, 2.5 grams of protein) and pt reports feeling very full and unable to eat any more.   She denies abdominal pain, pressure, or nausea with or without intakes. Grandson, who was at bedside, reports that at home pt has a very good appetite and eats "whatever she wants but I know we need to increase her fiber." He states that pt has not had a BM in "a long time" but is unable to give date of last BM. Pt denies any chewing or swallowing difficulties.   Physical assessment outlined below. Per chart review, pt has gained 9 lbs in the past 2-2.5 months. No edema was noted during physical assessment.   Medications reviewed; 1 tablet Senokot BID.  Labs reviewed.      NUTRITION -  FOCUSED PHYSICAL EXAM:    Most Recent Value  Orbital Region  Mild depletion  Upper Arm Region  Severe depletion  Thoracic and Lumbar Region  Unable to assess  Buccal Region  Moderate depletion  Temple Region  Mild depletion  Clavicle Bone Region  Moderate depletion  Clavicle and Acromion Bone Region  Severe depletion  Scapular Bone Region  Unable to assess  Dorsal Hand  Moderate depletion  Patellar Region  Mild depletion  Anterior Thigh Region  Severe depletion  Posterior Calf Region  Severe depletion  Edema (RD Assessment)  None  Hair  Reviewed  Eyes  Reviewed  Mouth  Reviewed  Skin  Reviewed  Nails  Reviewed       Diet Order:  Diet Heart Room service appropriate? Yes; Fluid consistency: Thin  EDUCATION NEEDS:   No education needs have been identified at this time  Skin:  Skin Assessment: Reviewed RN Assessment  Last BM:  PTA/unknown  Height:   Ht Readings from Last 1 Encounters:  04/05/17 4\' 10"  (1.473 m)    Weight:   Wt Readings from Last 1 Encounters:  04/06/17 76 lb 15.1 oz (34.9 kg)    Ideal Body Weight:  42.18 kg  BMI:  Body mass index is 16.08 kg/m.  Estimated Nutritional Needs:   Kcal:  1150-1325 (33-38 kcal/kg)  Protein:  42-52 grams (1.2-1.5 grams/kg)  Fluid:  >/= 1.3 L/day     Trenton GammonJessica Eowyn Tabone, MS, RD, LDN, [facility] Inpatient Clinical Dietitian Pager # [phone] After hours/weekend pager # [phone]

## 2017-04-08 NOTE — Progress Notes (Signed)
Patient ID: Shannon Riggs, female   DOB: Jun 27, 1925, 84 y.o.   MRN: [id number]    PROGRESS NOTE  Shannon Riggs  [id number] DOB: Jun 27, 1925 DOA: 04/05/2017  PCP: Dorothyann PengSanders, Robyn, MD   Brief Narrative:  Pt is 84 yo female with HTN, dementia, presented with progressively worsening post neck pain 2-3 days in duration. Pt reports an episode of fall about two weeks ago but no other traumas. Pt has reported overall weakness and poor oral intake, dysuria. At baseline, pt ambulates independently.   ED Course: On admission into the emergency department patient was noted to be afebrile, pulse 104-125, respirations 17-22, blood pressures 150/88-181/92, and O2 saturation maintained on room air.  Labs revealed WBC 15.4 and urinalysis with small leukocytes rare bacteria and 6-30 WBCs.  Patient was given empiric antibiotics of Rocephin.  CT scan of the head and cervical spine showed severe osteoarthritis of C1-2.  Neurosurgery was consulted and recommended giving steroids as patient likely not a surgical candidate.  Patient was given 10 mg of Decadron IV, lidocaine patch, and fentanyl for pain.   Assessment & Plan:  Cervical neck pain with evidence of disc disease: Acute - severe C1-C2 OA with inflammatory arthropathy - per neurosurgery, place on steroids, still on Dexamethasone - pt reports feeling better but has had nausea and vomiting last night  - plan to transition to medrol pack if no vomiting  - continue Lidoderm patch  - PT eval done, recommended HH PT   Abnormal urinalysis/ suspected urinary tract infection - keep on Rocephin - follow up on urine cultures   Vomiting - unclear etiology, also with constipation - will keep on bowel regimen - antiemetics as needed   Essential hypertension - continue Norvasc for now  - reasonable inpatient control   Generalized weakness:  - Patient noted to have recent fall 2 weeks ago.  Family has concerns with stability and possible need of  assistive aids. - HH PT will be ordered   Underweight  - Body mass index is 16.08 kg/m. - nutritionist consulted   DVT prophylaxis: Lovenox SQ  Code Status: Full  Family Communication: Patient at bedside  Disposition Plan: to be determined   Consultants:   None  Procedures:   None   Antimicrobials:   Rocephin 02/13 -->  Subjective: Reports feeling better, had nausea and vomiting last night.   Objective: Vitals:   04/07/17 1000 04/07/17 1346 04/07/17 2210 04/08/17 0618  BP: (!) 157/73 (!) 182/83 (!) 160/70 (!) 147/69  Pulse: 95 98 100 92  Resp: 16 14 15 16   Temp: 97.6 F (36.4 C) 97.7 F (36.5 C) 98.2 F (36.8 C) 98.5 F (36.9 C)  TempSrc: Oral Axillary Oral Oral  SpO2: 100% 96% 96% 96%  Weight:      Height:        Intake/Output Summary (Last 24 hours) at 04/08/2017 0840 Last data filed at 04/08/2017 [id number] Gross per 24 hour  Intake 1730 ml  Output 0 ml  Net 1730 ml   Filed Weights   04/05/17 2050 04/06/17 1123  Weight: 32.2 kg (71 lb) 34.9 kg (76 lb 15.1 oz)    Physical Exam  Constitutional: Appears well-developed and well-nourished. No distress.  CVS: RRR, S1/S2 +, no murmurs, no gallops, no carotid bruit.  Pulmonary: Effort and breath sounds normal, no stridor, rhonchi, wheezes, rales.  Abdominal: Soft. BS +,  no distension, tenderness, rebound or guarding.  Musculoskeletal: No edema and no tenderness.   Data Reviewed: I have  personally reviewed following labs and imaging studies  CBC: Recent Labs  Lab 04/05/17 2301 04/07/17 0623 04/08/17 0545  WBC 15.4* 11.5* 11.0*  NEUTROABS 12.4*  --   --   HGB 12.6 10.0* 9.8*  HCT 39.1 31.4* 30.0*  MCV 87.7 88.0 87.5  PLT 301 290 281   Basic Metabolic Panel: Recent Labs  Lab 04/05/17 2301 04/07/17 0623 04/08/17 0545  NA 139 142 141  K 3.5 3.7 3.5  CL 101 111 110  CO2 23 20* 22  GLUCOSE 119* 171* 159*  BUN 16 23* 23*  CREATININE 0.57 0.65 0.59  CALCIUM 9.8 9.2 9.3   Urinalysis:      Component Value Date/Time   COLORURINE YELLOW 04/06/2017 0045   APPEARANCEUR CLEAR 04/06/2017 0045   LABSPEC 1.025 04/06/2017 0045   PHURINE 7.0 04/06/2017 0045   GLUCOSEU NEGATIVE 04/06/2017 0045   HGBUR NEGATIVE 04/06/2017 0045   BILIRUBINUR NEGATIVE 04/06/2017 0045   KETONESUR NEGATIVE 04/06/2017 0045   PROTEINUR NEGATIVE 04/06/2017 0045   UROBILINOGEN 1.0 08/15/2013 1409   NITRITE NEGATIVE 04/06/2017 0045   LEUKOCYTESUR SMALL (A) 04/06/2017 0045    Recent Results (from the past 240 hour(s))  Urine culture     Status: Abnormal   Collection Time: 04/06/17  1:14 AM  Result Value Ref Range Status   Specimen Description   Final    URINE, CLEAN CATCH Performed at [facility], [address] [place], [place] [address]    Special Requests   Final    Normal Performed at [facility], [address], [place], [place] [address]    Culture MULTIPLE SPECIES PRESENT, SUGGEST RECOLLECTION (A)  Final   Report Status 04/07/2017 FINAL  Final  Blood culture (routine x 2)     Status: None (Preliminary result)   Collection Time: 04/06/17  1:42 AM  Result Value Ref Range Status   Specimen Description   Final    BLOOD RIGHT ARM Performed at [facility], [address], [place], [place] [address]    Special Requests   Final    BOTTLES DRAWN AEROBIC AND ANAEROBIC Blood Culture results may not be optimal due to an inadequate volume of blood received in culture bottles Performed at [facility], [address] [place], [place] [address]    Culture   Final    NO GROWTH 1 DAY Performed at [facility], [address] [place], [place] [address]    Report Status PENDING  Incomplete  Blood culture (routine x 2)     Status: None (Preliminary result)   Collection Time: 04/06/17  1:42 AM  Result Value Ref Range Status   Specimen Description   Final    BLOOD LEFT ARM Performed at [facility],  [address] [place], [place] [address]    Special Requests   Final    BOTTLES DRAWN AEROBIC AND ANAEROBIC Blood Culture adequate volume Performed at [facility], [address] [place], [place] [address]    Culture   Final    NO GROWTH 1 DAY Performed at [facility], [address] [place], [place] [address]    Report Status PENDING  Incomplete    Radiology Studies: Dg Chest 2 View Result Date: 04/06/2017 No acute cardiopulmonary abnormality. 2. Moderate to large chronic gastric hiatal hernia has not significantly changed since 2016.   Ct Head Wo Contrast Result Date: 04/06/2017 1. No acute intracranial process. 2.  Moderate chronic small vessel ischemic disease. RIGHT frontal encephalomalacia is likely posttraumatic. CT NECK: 1. No acute process in the neck. 2. LEFT submandibular sialolith with severe chronic sialadenosis. 3. Severe C1-2 osteoarthrosis associated with large volume pannus and mild basilar invagination associated with inflammatory arthropathy/CPPD. 4. Grade 1 C4-5 and C5-6 anterolisthesis on degenerative basis. No fracture. 5. Severe C3-4 through C6-7 neural foraminal narrowing.   Ct Soft Tissue Neck W Contrast Result Date: 04/06/2017 Grade 1 C4-5 and C5-6 anterolisthesis on degenerative basis. Severe atlantodental osteoarthrosis with subchondral cysts and mild basilar invagination. Associated large amount of pannus. Multilevel severe degenerative discs. No destructive bony lesions. Severe C3-4 through C6-7 neural foraminal narrowing. UPPER CHEST: Severe centrilobular emphysema. No mediastinal lymphadenopathy and included chest. OTHER: None. IMPRESSION: CT HEAD: 1. No acute intracranial process. 2. Moderate chronic small vessel ischemic disease. RIGHT frontal encephalomalacia is likely posttraumatic. CT NECK: 1. No acute process in the neck. 2. LEFT submandibular sialolith with severe chronic sialadenosis. 3. Severe C1-2 osteoarthrosis associated  with large volume pannus and mild basilar invagination associated with inflammatory arthropathy/CPPD. 4. Grade 1 C4-5 and C5-6 anterolisthesis on degenerative basis. No fracture. 5. Severe C3-4 through C6-7 neural foraminal narrowing. Aortic Atherosclerosis  Scheduled Meds: . amLODipine  10 mg Oral Daily  . dexamethasone  4 mg Intravenous Q8H  . enoxaparin (LOVENOX) injection  30 mg Subcutaneous Q24H  . lidocaine  2 patch Transdermal Q24H  . mouth rinse  15 mL Mouth Rinse BID  . senna-docusate  1 tablet Oral BID   Continuous Infusions: . cefTRIAXone (ROCEPHIN)  IV Stopped (04/07/17 2345)    LOS: 1 day   Time spent: 25 minutes   Debbora Presto, MD [facility] Pager [phone]  If 7PM-7AM, please contact night-coverage [email or web address] Password [email or web address] 04/08/2017, 8:40 AM

## 2017-04-08 NOTE — Evaluation (Signed)
Occupational Therapy Evaluation Patient Details Name: Shannon Riggs MRN: [id number] DOB: 1926/02/04 Today's Date: 04/08/2017    History of Present Illness Pt admitted s/p fall and with torticollis and headache   Clinical Impression   This 84 year old female was admitted for the above.  She was independent with adls prior to admission and had intermittent supervision/assistance.  Will follow in acute with supervision level goals.      Follow Up Recommendations  Supervision/Assistance - 24 hour;Home health OT    Equipment Recommendations  (to be further assessed:  shower DME?)    Recommendations for Other Services       Precautions / Restrictions Precautions Precautions: Fall Restrictions Weight Bearing Restrictions: No      Mobility Bed Mobility              OOB    Transfers       Sit to Stand: Supervision         General transfer comment: cues for UE placement on walker    Balance                                           ADL either performed or assessed with clinical judgement   ADL Overall ADL's : Needs assistance/impaired                                       General ADL Comments: pt needs set up/supervision for adls, cues for hand placement on walker. She has h/o falls.  Min guard for safety when walking to bathroom; pt tends to move quickly     Vision         Perception     Praxis      Pertinent Vitals/Pain Pain Assessment: No/denies pain     Hand Dominance     Extremity/Trunk Assessment Upper Extremity Assessment Upper Extremity Assessment: Generalized weakness       Cervical / Trunk Assessment Cervical / Trunk Assessment: (has soft collar for comfort).  Pt tends to flex neck to get collar around chin     Communication Communication Communication: No difficulties   Cognition Arousal/Alertness: Awake/alert Behavior During Therapy: WFL for tasks assessed/performed;Impulsive Overall  Cognitive Status: No family/caregiver present to determine baseline cognitive functioning                                     General Comments       Exercises     Shoulder Instructions      Home Living Family/patient expects to be discharged to:: Private residence Living Arrangements: Other relatives Available Help at Discharge: Family                   Bathroom Toilet: Standard     Home Equipment: None   Additional Comments: pt states she lives with grandson, who works and is in/out      Prior Functioning/Environment Level of Independence: Independent                 OT Problem List: Decreased strength;Decreased activity tolerance;Impaired balance (sitting and/or standing);Decreased safety awareness      OT Treatment/Interventions: Self-care/ADL training;DME and/or AE instruction;Patient/family education;Balance training    OT Goals(Current goals can  be found in the care plan section) Acute Rehab OT Goals Patient Stated Goal: HOME` OT Goal Formulation: With patient Time For Goal Achievement: 04/22/17 Potential to Achieve Goals: Good ADL Goals Pt Will Transfer to Toilet: with supervision;ambulating;regular height toilet Additional ADL Goal #1: pt will safely gather clothes at supervision level for adl  OT Frequency: Min 2X/week   Barriers to D/C:            Co-evaluation              AM-PAC PT "6 Clicks" Daily Activity     Outcome Measure Help from another person eating meals?: None Help from another person taking care of personal grooming?: A Little Help from another person toileting, which includes using toliet, bedpan, or urinal?: A Little Help from another person bathing (including washing, rinsing, drying)?: A Little Help from another person to put on and taking off regular upper body clothing?: A Little Help from another person to put on and taking off regular lower body clothing?: A Little 6 Click Score: 19   End of  Session    Activity Tolerance: Patient tolerated treatment well Patient left: in chair;with call bell/phone within reach;with chair alarm set  OT Visit Diagnosis: Unsteadiness on feet (R26.81);Muscle weakness (generalized) (M62.81)                Time: 1610-9604 OT Time Calculation (min): 13 min Charges:  OT General Charges $OT Visit: 1 Visit G-Codes:     [place], OTR/L [phone] 04/08/2017  Shannon Riggs 04/08/2017, 9:22 AM

## 2017-04-09 LAB — CBC
HCT: 35.2 % — ABNORMAL LOW (ref 36.0–46.0)
Hemoglobin: 11 g/dL — ABNORMAL LOW (ref 12.0–15.0)
MCH: 27.4 pg (ref 26.0–34.0)
MCHC: 31.3 g/dL (ref 30.0–36.0)
MCV: 87.8 fL (ref 78.0–100.0)
PLATELETS: 332 10*3/uL (ref 150–400)
RBC: 4.01 MIL/uL (ref 3.87–5.11)
RDW: 16.7 % — AB (ref 11.5–15.5)
WBC: 7.8 10*3/uL (ref 4.0–10.5)

## 2017-04-09 LAB — BASIC METABOLIC PANEL
Anion gap: 10 (ref 5–15)
BUN: 20 mg/dL (ref 6–20)
CALCIUM: 9.6 mg/dL (ref 8.9–10.3)
CHLORIDE: 103 mmol/L (ref 101–111)
CO2: 26 mmol/L (ref 22–32)
CREATININE: 0.5 mg/dL (ref 0.44–1.00)
GFR calc non Af Amer: >60 mL/min (ref >60)
Glucose, Bld: 114 mg/dL — ABNORMAL HIGH (ref 65–99)
Potassium: 4.4 mmol/L (ref 3.5–5.1)
SODIUM: 139 mmol/L (ref 135–145)

## 2017-04-09 LAB — URINE CULTURE: CULTURE: NO GROWTH

## 2017-04-09 MED ORDER — HYDRALAZINE HCL 20 MG/ML IJ SOLN
5.0000 mg | INTRAMUSCULAR | Status: DC | PRN
  Administered 2017-04-09: 5 mg via INTRAVENOUS
  Filled 2017-04-09: qty 1

## 2017-04-09 MED ORDER — PANTOPRAZOLE SODIUM 40 MG PO TBEC
40.0000 mg | DELAYED_RELEASE_TABLET | Freq: Every day | ORAL | Status: DC
  Administered 2017-04-09 – 2017-04-10 (×2): 40 mg via ORAL
  Filled 2017-04-09 (×2): qty 1

## 2017-04-09 MED ORDER — GI COCKTAIL ~~LOC~~
30.0000 mL | Freq: Three times a day (TID) | ORAL | Status: DC | PRN
  Filled 2017-04-09: qty 30

## 2017-04-09 NOTE — Care Management Note (Signed)
Case Management Note  Patient Details  Name: Shannon Riggs MRN: [id number] Date of Birth: September 07, 1925  Subjective/Objective:    Cervical neck pain, UTI                Action/Plan: Spoke to pt and caregiver, Shannon Riggs at bedside. Pt lives in home with grandson. He is requesting a RW with seat. She has a RW that was delivered to room. Contacted [facility] to switch DME. Offered choice for HH/list provided. [facility]. Contacted [facility] rep with new referral for HHPT.    Expected Discharge Date:  04/07/17               Expected Discharge Plan:  Home w Home Health Services  In-House Referral:  NA  Discharge planning Services  CM Consult  Post Acute Care Choice:  [facility] Health [facility]  DME Arranged:  Walker rolling with seat DME Agency:  [facility]  HH Arranged:  PT HH Agency:  [facility]  Status of Service:  Completed, signed off  If discussed at Long Length of Stay Meetings, dates discussed:    Additional Comments:  Elliot CousinShavis, Robbyn Hodkinson Ellen, RN 04/09/2017, 12:42 PM

## 2017-04-09 NOTE — Progress Notes (Signed)
Patient ID: Shannon Riggs, female   DOB: [birth year], 84 y.o.   MRN: [id number]    PROGRESS NOTE  Shannon Riggs  [id number] DOB: [birth year] DOA: 04/05/2017  PCP: Dorothyann PengSanders, Robyn, MD   Brief Narrative:  Pt is 84 yo female with HTN, dementia, presented with progressively worsening post neck pain 2-3 days in duration. Pt reports an episode of fall about two weeks ago but no other traumas. Pt has reported overall weakness and poor oral intake, dysuria. At baseline, pt ambulates independently.   ED Course: On admission into the emergency department patient was noted to be afebrile, pulse 104-125, respirations 17-22, blood pressures 150/88-181/92, and O2 saturation maintained on room air.  Labs revealed WBC 15.4 and urinalysis with small leukocytes rare bacteria and 6-30 WBCs.  Patient was given empiric antibiotics of Rocephin.  CT scan of the head and cervical spine showed severe osteoarthritis of C1-2.  Neurosurgery was consulted and recommended giving steroids as patient likely not a surgical candidate.  Patient was given 10 mg of Decadron IV, lidocaine patch, and fentanyl for pain.   Assessment & Plan:  Cervical neck pain with evidence of disc disease: Acute - severe C1-C2 OA with inflammatory arthropathy - per neurosurgery, placed on steroids, still on Dexamethasone - will not change to oral steroids yet as pt still with nausea and intermittent vomiting - will reassess in AM - pain is better controlled and pt says she is able to move more  - continue Lidoderm patch  - PT eval done, recommended HH PT   Abnormal urinalysis/ suspected urinary tract infection - keep on Rocephin - follow up on urine cultures   Vomiting - unclear etiology, still with minimal BM - will continue bowel regimen - antiemetics as needed - place on PPI   Essential hypertension - continue Norvasc for now  - SBP is in 150's this AM - will add Hydralazine as needed   Generalized weakness:  - Patient  noted to have recent fall 2 weeks ago.  Family has concerns with stability and possible need of assistive aids. - HH PT will be ordered   Underweight  - Body mass index is 16.08 kg/m. - nutritionist consulted   DVT prophylaxis: Lovenox SQ  Code Status: Full  Family Communication: pt at bedside  Disposition Plan: to be determined, possibly in AM   Consultants:   None  Procedures:   None   Antimicrobials:   Rocephin 02/13 -->  Subjective: Overall better, still with intermittent vomiting.   Objective: Vitals:   04/08/17 1411 04/08/17 2011 04/09/17 0352 04/09/17 0357  BP: (!) 155/63 (!) 164/82 (!) 188/87 (!) 158/88  Pulse: 95 (!) 106 89   Resp: 16 15 14    Temp: 98.4 F (36.9 C) 99 F (37.2 C) 97.8 F (36.6 C)   TempSrc: Oral Oral Oral   SpO2: 96% 100% 97%   Weight:      Height:        Intake/Output Summary (Last 24 hours) at 04/09/2017 0925 Last data filed at 04/09/2017 0353 Gross per 24 hour  Intake 830 ml  Output -  Net 830 ml   Filed Weights   04/05/17 2050 04/06/17 1123  Weight: 32.2 kg (71 lb) 34.9 kg (76 lb 15.1 oz)    Physical Exam  Constitutional: Appears calm, NAD, cachectic  CVS: RRR, S1/S2 +, no murmurs, no gallops, no carotid bruit.  Pulmonary: Effort and breath sounds normal, no stridor, rhonchi, wheezes, rales.  Abdominal: Soft. BS +,  no distension, tenderness, rebound or guarding.  Musculoskeletal: Normal range of motion Psychiatric: Normal mood and affect. Behavior, judgment, thought content normal.   Data Reviewed: I have personally reviewed following labs and imaging studies  CBC: Recent Labs  Lab 04/05/17 2301 04/07/17 0623 04/08/17 0545 04/09/17 0518  WBC 15.4* 11.5* 11.0* 7.8  NEUTROABS 12.4*  --   --   --   HGB 12.6 10.0* 9.8* 11.0*  HCT 39.1 31.4* 30.0* 35.2*  MCV 87.7 88.0 87.5 87.8  PLT 301 290 281 332   Basic Metabolic Panel: Recent Labs  Lab 04/05/17 2301 04/07/17 0623 04/08/17 0545 04/09/17 0518  NA 139 142  141 139  K 3.5 3.7 3.5 4.4  CL 101 111 110 103  CO2 23 20* 22 26  GLUCOSE 119* 171* 159* 114*  BUN 16 23* 23* 20  CREATININE 0.57 0.65 0.59 0.50  CALCIUM 9.8 9.2 9.3 9.6   Urinalysis:    Component Value Date/Time   COLORURINE YELLOW 04/06/2017 0045   APPEARANCEUR CLEAR 04/06/2017 0045   LABSPEC 1.025 04/06/2017 0045   PHURINE 7.0 04/06/2017 0045   GLUCOSEU NEGATIVE 04/06/2017 0045   HGBUR NEGATIVE 04/06/2017 0045   BILIRUBINUR NEGATIVE 04/06/2017 0045   KETONESUR NEGATIVE 04/06/2017 0045   PROTEINUR NEGATIVE 04/06/2017 0045   UROBILINOGEN 1.0 08/15/2013 1409   NITRITE NEGATIVE 04/06/2017 0045   LEUKOCYTESUR SMALL (A) 04/06/2017 0045    Recent Results (from the past 240 hour(s))  Urine culture     Status: Abnormal   Collection Time: 04/06/17  1:14 AM  Result Value Ref Range Status   Specimen Description   Final    URINE, CLEAN CATCH Performed at [facility], [address], [place], [place] [address]    Special Requests   Final    Normal Performed at [facility], [address], [place], [place] [address]    Culture MULTIPLE SPECIES PRESENT, SUGGEST RECOLLECTION (A)  Final   Report Status 04/07/2017 FINAL  Final  Blood culture (routine x 2)     Status: None (Preliminary result)   Collection Time: 04/06/17  1:42 AM  Result Value Ref Range Status   Specimen Description   Final    BLOOD RIGHT ARM Performed at [facility], [address] [place], [place] [address]    Special Requests   Final    BOTTLES DRAWN AEROBIC AND ANAEROBIC Blood Culture results may not be optimal due to an inadequate volume of blood received in culture bottles Performed at [facility], [address] [place], [place] [address]    Culture   Final    NO GROWTH 2 DAYS Performed at [facility], [address] [place], [place] [address]    Report Status PENDING  Incomplete  Blood culture (routine x 2)      Status: None (Preliminary result)   Collection Time: 04/06/17  1:42 AM  Result Value Ref Range Status   Specimen Description   Final    BLOOD LEFT ARM Performed at [facility], [address] [place], [place] [address]    Special Requests   Final    BOTTLES DRAWN AEROBIC AND ANAEROBIC Blood Culture adequate volume Performed at [facility], [address], [place], [place] [address]    Culture   Final    NO GROWTH 2 DAYS Performed at [facility], [address] [place], [place] [address]    Report Status PENDING  Incomplete  Culture, Urine     Status: None   Collection Time: 04/07/17  6:24 PM  Result Value Ref Range Status   Specimen Description   Final    URINE, CLEAN CATCH Performed at [facility], [address] [place], [place] [address]    Special Requests   Final    NONE Performed at [facility], [address] [place], [place] [address]    Culture   Final    NO GROWTH Performed at [facility], [address] [place], [place] [address]    Report Status 04/09/2017 FINAL  Final    Radiology Studies: Dg Chest 2 View Result Date: 04/06/2017 No acute cardiopulmonary abnormality. 2. Moderate to large chronic gastric hiatal hernia has not significantly changed since 2016.   Ct Head Wo Contrast Result Date: 04/06/2017 1. No acute intracranial process. 2. Moderate chronic small vessel ischemic disease. RIGHT frontal encephalomalacia is likely posttraumatic. CT NECK: 1. No acute process in the neck. 2. LEFT submandibular sialolith with severe chronic sialadenosis. 3. Severe C1-2 osteoarthrosis associated with large volume pannus and mild basilar invagination associated with inflammatory arthropathy/CPPD. 4. Grade 1 C4-5 and C5-6 anterolisthesis on degenerative basis. No fracture. 5. Severe C3-4 through C6-7 neural foraminal narrowing.   Ct Soft Tissue Neck W Contrast Result Date:  04/06/2017 Grade 1 C4-5 and C5-6 anterolisthesis on degenerative basis. Severe atlantodental osteoarthrosis with subchondral cysts and mild basilar invagination. Associated large amount of pannus. Multilevel severe degenerative discs. No destructive bony lesions. Severe C3-4 through C6-7 neural foraminal narrowing. UPPER CHEST: Severe centrilobular emphysema. No mediastinal lymphadenopathy and included chest. OTHER: None. IMPRESSION: CT HEAD: 1. No acute intracranial process. 2. Moderate chronic small vessel ischemic disease. RIGHT frontal encephalomalacia is likely posttraumatic. CT NECK: 1. No acute process in the neck. 2. LEFT submandibular sialolith with severe chronic sialadenosis. 3. Severe C1-2 osteoarthrosis associated with large volume pannus and mild basilar invagination associated with inflammatory arthropathy/CPPD. 4. Grade 1 C4-5 and C5-6 anterolisthesis on degenerative basis. No fracture. 5. Severe C3-4 through C6-7 neural foraminal narrowing. Aortic Atherosclerosis  Scheduled Meds: . amLODipine  10 mg Oral Daily  . dexamethasone  4 mg Intravenous Q8H  . enoxaparin (LOVENOX) injection  30 mg Subcutaneous Q24H  . feeding supplement  1 Container Oral BID BM  . lidocaine  2 patch Transdermal Q24H  . mouth rinse  15 mL Mouth Rinse BID  . senna-docusate  1 tablet Oral BID   Continuous Infusions: . cefTRIAXone (ROCEPHIN)  IV Stopped (04/09/17 0054)    LOS: 2 days   Time spent: 25 minutes   Debbora Presto, MD [facility] Pager [phone]  If 7PM-7AM, please contact night-coverage [email or web address] Password [email or web address] 04/09/2017, 9:25 AM

## 2017-04-10 MED ORDER — METHYLPREDNISOLONE 4 MG PO TBPK
8.0000 mg | ORAL_TABLET | Freq: Every morning | ORAL | Status: AC
  Administered 2017-04-10: 09:00:00 8 mg via ORAL
  Filled 2017-04-10: qty 21

## 2017-04-10 MED ORDER — METHYLPREDNISOLONE 4 MG PO TBPK: 4.0000 mg | ORAL_TABLET | ORAL | Status: DC

## 2017-04-10 MED ORDER — TRAMADOL HCL 50 MG PO TABS: 50.0000 mg | ORAL_TABLET | Freq: Four times a day (QID) | ORAL | 0 refills | Status: AC | PRN

## 2017-04-10 MED ORDER — LIDOCAINE 5 % EX PTCH: 1.0000 | MEDICATED_PATCH | CUTANEOUS | 0 refills | Status: AC

## 2017-04-10 MED ORDER — METHYLPREDNISOLONE 4 MG PO TBPK: 8.0000 mg | ORAL_TABLET | Freq: Every evening | ORAL | Status: DC

## 2017-04-10 MED ORDER — POLYETHYLENE GLYCOL 3350 17 G PO PACK: 17.0000 g | PACK | Freq: Every day | ORAL | 0 refills | Status: AC | PRN

## 2017-04-10 MED ORDER — METHYLPREDNISOLONE 4 MG PO TBPK: 4.0000 mg | ORAL_TABLET | Freq: Three times a day (TID) | ORAL | Status: DC

## 2017-04-10 MED ORDER — LIDOCAINE 5 % EX PTCH: 1.0000 | MEDICATED_PATCH | CUTANEOUS | 0 refills | Status: DC

## 2017-04-10 MED ORDER — METHYLPREDNISOLONE 4 MG PO TBPK: 4.0000 mg | ORAL_TABLET | Freq: Four times a day (QID) | ORAL | Status: DC

## 2017-04-10 MED ORDER — METHYLPREDNISOLONE 4 MG PO TBPK: ORAL_TABLET | ORAL | Status: AC

## 2017-04-10 NOTE — Discharge Instructions (Signed)

## 2017-04-10 NOTE — Discharge Summary (Signed)
Physician Discharge Summary  Shannon Riggs NWG:[id number] DOB: Jun 21, 1925 DOA: 04/05/2017  PCP: Dorothyann Peng, MD  Admit date: 04/05/2017 Discharge date: 04/10/2017  Recommendations for Outpatient Follow-up:  1. Pt will need to follow up with PCP in 2-3 weeks post discharge 2. Please obtain BMP to evaluate electrolytes and kidney function 3. Please also check CBC to evaluate Hg and Hct levels 4. Pt made aware to complete Medrol dose pack as recommended per neurosurgery team   Discharge Diagnoses:  Principal Problem:   Cervical neck pain with evidence of disc disease Active Problems:   HTN (hypertension)   Abnormal urinalysis   Generalized weakness   Leukocytosis  Discharge Condition: Stable  Diet recommendation: Heart healthy diet discussed in details   History of present illness:  Pt is 84 yo female with HTN, dementia, presented with progressively worsening post neck pain 2-3 days in duration. Pt reports an episode of fall about two weeks ago but no other traumas. Pt has reported overall weakness and poor oral intake, dysuria. At baseline, pt ambulates independently.   ED Course:On admission into the emergency department patient was noted to be afebrile, pulse 104-125, respirations 17-22, blood pressures 150/88-181/92, and O2 saturation maintained on room air. Labs revealed WBC 15.4 and urinalysis with small leukocytes rare bacteria and 6-30 WBCs. Patient was given empiric antibiotics of Rocephin. CT scan of the head and cervical spine showed severe osteoarthritis of C1-2.Neurosurgery was consulted and recommended giving steroids as patient likely not a surgical candidate. Patient was given 10 mg of Decadron IV, lidocaine patch,and fentanyl for pain.   Assessment & Plan:  Cervical neck pain with evidence of disc disease:Acute - severe C1-C2 OA with inflammatory arthropathy - per neurosurgery, placed on steroid Dexamethasone and did well - pain controlled  - plan to  change to Medrol dose pack for pt to complete therapy - family made aware that pt is clear for discharge with outpatient follow up - family also made aware that no surgical intervention indicated at this time per neurosurgery team   Abnormal urinalysis/suspected urinary tract infection - completed therapy with Rocephin while inpatient and therefore no need for outpatient therapy   Vomiting - unclear etiology - now resolved - pt tolerating diet so far   Essential hypertension - resume home medical regimen Amlodipine and Lisinopril - HCTZ  Generalized weakness: - Patient noted to have recent fall 2 weeks ago. Family has concerns with stability and possible need of assistive aids. - HH PT ordered   Underweight  - Body mass index is 16.08 kg/m. - nutritionist consulted   DVT prophylaxis: Lovenox SQ  Code Status: Full  Family Communication: pt at bedside, grandson over the phone  Disposition Plan: home today   Consultants:   None  Procedures:   None   Antimicrobials:   Rocephin 02/13 --> 02/17  Procedures/Studies: Dg Chest 2 View  Result Date: 04/06/2017 CLINICAL DATA:  84 year old female with weakness, head and neck pain. EXAM: CHEST  2 VIEW COMPARISON:  01/27/2015 and earlier. FINDINGS: Semi upright AP and lateral views of the chest. Moderate to large gas and fluid containing gastric hiatal hernia. Hernia size not significantly changed compared to 2016. Other mediastinal contours are within normal limits. Visualized tracheal air column is within normal limits. No pneumothorax, pulmonary edema, pleural effusion or confluent pulmonary opacity. Negative visible bowel gas pattern in the abdomen. Degenerative changes in the spine and at both shoulders. No acute osseous abnormality identified. IMPRESSION: 1.  No acute cardiopulmonary abnormality. 2.  Moderate to large chronic gastric hiatal hernia has not significantly changed since 2016. Electronically Signed   By: Odessa Fleming M.D.   On: 04/06/2017 00:14   Ct Head Wo Contrast  Result Date: 04/06/2017 CLINICAL DATA:  Head and neck pain. Leukocytosis. History of hypertension, thyroid cyst. EXAM: CT HEAD WITHOUT CONTRAST CT NECK WITH CONTRAST TECHNIQUE: Contiguous axial images were obtained from the base of the skull through the vertex without intravenous contrast. Multidetector CT imaging of the and neck was performed using the standard protocol following the bolus administration of intravenous contrast. CONTRAST:  75 cc Isovue-300 COMPARISON:  CT HEAD March 08, 2017 FINDINGS: CT HEAD FINDINGS BRAIN: No intraparenchymal hemorrhage, mass effect nor midline shift. The ventricles and sulci are normal for age. Patchy supratentorial white matter hypodensities within normal range for patient's age, though non-specific are most compatible with chronic small vessel ischemic disease. Small area RIGHT inferior frontal lobe encephalomalacia. No acute large vascular territory infarcts. No abnormal extra-axial fluid collections. Basal cisterns are patent. VASCULAR: Severe calcific atherosclerosis of the carotid siphons. SKULL: No skull fracture. No significant scalp soft tissue swelling. SINUSES/ORBITS: Scattered mucosal retention cyst. Included ocular globes and orbital contents are non-suspicious. Old LEFT medial orbital blowout fracture. Status post bilateral ocular lens implants. OTHER: None. CT NECK FINDINGS PHARYNX AND LARYNX: Normal.  Widely patent airway. SALIVARY GLANDS: Severely atrophic LEFT submandibular gland with 5 mm LEFT submandibular sialolith, 3 mm sialolith LEFT submandibular duct. THYROID: 8 mm LEFT thyroid nodule, below size follow-up evaluation, otherwise unremarkable. LYMPH NODES: No lymphadenopathy by CT size criteria. VASCULAR: Mild calcific atherosclerosis aortic arch. Moderate calcific atherosclerosis bilateral carotid bifurcations, not tailored for assessment of potentially hemodynamically significant stenosis.  SKELETON: Grade 1 C4-5 and C5-6 anterolisthesis on degenerative basis. Severe atlantodental osteoarthrosis with subchondral cysts and mild basilar invagination. Associated large amount of pannus. Multilevel severe degenerative discs. No destructive bony lesions. Severe C3-4 through C6-7 neural foraminal narrowing. UPPER CHEST: Severe centrilobular emphysema. No mediastinal lymphadenopathy and included chest. OTHER: None. IMPRESSION: CT HEAD: 1. No acute intracranial process. 2. Moderate chronic small vessel ischemic disease. RIGHT frontal encephalomalacia is likely posttraumatic. CT NECK: 1. No acute process in the neck. 2. LEFT submandibular sialolith with severe chronic sialadenosis. 3. Severe C1-2 osteoarthrosis associated with large volume pannus and mild basilar invagination associated with inflammatory arthropathy/CPPD. 4. Grade 1 C4-5 and C5-6 anterolisthesis on degenerative basis. No fracture. 5. Severe C3-4 through C6-7 neural foraminal narrowing. Aortic Atherosclerosis (ICD10-I70.0) and Emphysema (ICD10-J43.9). Electronically Signed   By: Awilda Metro M.D.   On: 04/06/2017 00:28   Ct Soft Tissue Neck W Contrast  Result Date: 04/06/2017 CLINICAL DATA:  Head and neck pain. Leukocytosis. History of hypertension, thyroid cyst. EXAM: CT HEAD WITHOUT CONTRAST CT NECK WITH CONTRAST TECHNIQUE: Contiguous axial images were obtained from the base of the skull through the vertex without intravenous contrast. Multidetector CT imaging of the and neck was performed using the standard protocol following the bolus administration of intravenous contrast. CONTRAST:  75 cc Isovue-300 COMPARISON:  CT HEAD March 08, 2017 FINDINGS: CT HEAD FINDINGS BRAIN: No intraparenchymal hemorrhage, mass effect nor midline shift. The ventricles and sulci are normal for age. Patchy supratentorial white matter hypodensities within normal range for patient's age, though non-specific are most compatible with chronic small vessel  ischemic disease. Small area RIGHT inferior frontal lobe encephalomalacia. No acute large vascular territory infarcts. No abnormal extra-axial fluid collections. Basal cisterns are patent. VASCULAR: Severe calcific atherosclerosis of the carotid siphons. SKULL: No  skull fracture. No significant scalp soft tissue swelling. SINUSES/ORBITS: Scattered mucosal retention cyst. Included ocular globes and orbital contents are non-suspicious. Old LEFT medial orbital blowout fracture. Status post bilateral ocular lens implants. OTHER: None. CT NECK FINDINGS PHARYNX AND LARYNX: Normal.  Widely patent airway. SALIVARY GLANDS: Severely atrophic LEFT submandibular gland with 5 mm LEFT submandibular sialolith, 3 mm sialolith LEFT submandibular duct. THYROID: 8 mm LEFT thyroid nodule, below size follow-up evaluation, otherwise unremarkable. LYMPH NODES: No lymphadenopathy by CT size criteria. VASCULAR: Mild calcific atherosclerosis aortic arch. Moderate calcific atherosclerosis bilateral carotid bifurcations, not tailored for assessment of potentially hemodynamically significant stenosis. SKELETON: Grade 1 C4-5 and C5-6 anterolisthesis on degenerative basis. Severe atlantodental osteoarthrosis with subchondral cysts and mild basilar invagination. Associated large amount of pannus. Multilevel severe degenerative discs. No destructive bony lesions. Severe C3-4 through C6-7 neural foraminal narrowing. UPPER CHEST: Severe centrilobular emphysema. No mediastinal lymphadenopathy and included chest. OTHER: None. IMPRESSION: CT HEAD: 1. No acute intracranial process. 2. Moderate chronic small vessel ischemic disease. RIGHT frontal encephalomalacia is likely posttraumatic. CT NECK: 1. No acute process in the neck. 2. LEFT submandibular sialolith with severe chronic sialadenosis. 3. Severe C1-2 osteoarthrosis associated with large volume pannus and mild basilar invagination associated with inflammatory arthropathy/CPPD. 4. Grade 1 C4-5 and  C5-6 anterolisthesis on degenerative basis. No fracture. 5. Severe C3-4 through C6-7 neural foraminal narrowing. Aortic Atherosclerosis (ICD10-I70.0) and Emphysema (ICD10-J43.9). Electronically Signed   By: Awilda Metroourtnay  Bloomer M.D.   On: 04/06/2017 00:28     Discharge Exam: Vitals:   04/09/17 2102 04/10/17 0510  BP: (!) 169/84 (!) 168/79  Pulse: 98 79  Resp: 16 13  Temp: 97.8 F (36.6 C) 98.1 F (36.7 C)  SpO2: 100% 95%   Vitals:   04/09/17 1325 04/09/17 1500 04/09/17 2102 04/10/17 0510  BP: (!) 160/84 133/68 (!) 169/84 (!) 168/79  Pulse: (!) 107 88 98 79  Resp: 14  16 13   Temp: (!) 97.4 F (36.3 C) 98.1 F (36.7 C) 97.8 F (36.6 C) 98.1 F (36.7 C)  TempSrc: Oral Oral Oral Oral  SpO2:   100% 95%  Weight:      Height:        General: Pt is alert, calm, NAD Cardiovascular: Regular rate and rhythm, S1/S2 +, no murmurs, no rubs, no gallops Respiratory: Clear to auscultation bilaterally, no wheezing, no crackles, no rhonchi Abdominal: Soft, non tender, non distended, bowel sounds +, no guarding  Discharge Instructions  Discharge Instructions    Diet - low sodium heart healthy   Complete by:  As directed    Increase activity slowly   Complete by:  As directed      Allergies as of 04/10/2017   No Known Allergies     Medication List    STOP taking these medications   cyclobenzaprine 5 MG tablet Commonly known as:  FLEXERIL   mirtazapine 15 MG tablet Commonly known as:  REMERON     TAKE these medications   amLODipine 10 MG tablet Commonly known as:  NORVASC Take 10 mg by mouth daily.   ferrous sulfate 325 (65 FE) MG tablet Take 1 tablet (325 mg total) by mouth daily with breakfast.   lidocaine 5 % Commonly known as:  LIDODERM Place 1 patch onto the skin daily. Remove & Discard patch within 12 hours or as directed by MD   lisinopril-hydrochlorothiazide 20-12.5 MG tablet Commonly known as:  PRINZIDE,ZESTORETIC Take 1 tablet by mouth daily.    methylPREDNISolone 4 MG Tbpk tablet  Commonly known as:  MEDROL DOSEPAK Patient will take medrol dose pack that was started in the hospital   polyethylene glycol packet Commonly known as:  MIRALAX / GLYCOLAX Take 17 g by mouth daily as needed for mild constipation.   traMADol 50 MG tablet Commonly known as:  ULTRAM Take 1 tablet (50 mg total) by mouth every 6 (six) hours as needed.            Durable Medical Equipment  (From admission, onward)        Start     Ordered   04/09/17 1202  For home use only DME 4 wheeled rolling walker with seat  Once    Question:  Patient needs a walker to treat with the following condition  Answer:  Cervical neck pain with evidence of disc disease   04/09/17 1201   04/06/17 1331  For home use only DME Walker rolling  Once    Question:  Patient needs a walker to treat with the following condition  Answer:  Weakness generalized   04/06/17 1331     Follow-up Information    [facility]-Home Follow up.   Specialty:  Home Health Services Why:  Home Health Physical Therapy -agency will call to arrange initial visit Contact information: [address] [place] [address] [phone]        Dorothyann Peng, MD Follow up.   Specialty:  Internal Medicine Contact information: [address] [place] [address] [phone]          The results of significant diagnostics from this hospitalization (including imaging, microbiology, ancillary and laboratory) are listed below for reference.    Microbiology: Recent Results (from the past 240 hour(s))  Urine culture     Status: Abnormal   Collection Time: 04/06/17  1:14 AM  Result Value Ref Range Status   Specimen Description   Final    URINE, CLEAN CATCH Performed at [facility], [address] [place], [place] [address]    Special Requests   Final    Normal Performed at [facility], [address]  [place], [place] [address]    Culture MULTIPLE SPECIES PRESENT, SUGGEST RECOLLECTION (A)  Final   Report Status 04/07/2017 FINAL  Final  Blood culture (routine x 2)     Status: None (Preliminary result)   Collection Time: 04/06/17  1:42 AM  Result Value Ref Range Status   Specimen Description   Final    BLOOD RIGHT ARM Performed at [facility], [address] [place], [place] [address]    Special Requests   Final    BOTTLES DRAWN AEROBIC AND ANAEROBIC Blood Culture results may not be optimal due to an inadequate volume of blood received in culture bottles Performed at [facility], [address], [place], [place] [address]    Culture   Final    NO GROWTH 3 DAYS Performed at [facility], [address], [place], [place] [address]    Report Status PENDING  Incomplete  Blood culture (routine x 2)     Status: None (Preliminary result)   Collection Time: 04/06/17  1:42 AM  Result Value Ref Range Status   Specimen Description   Final    BLOOD LEFT ARM Performed at [facility], [address] [place], [place] [address]    Special Requests   Final    BOTTLES DRAWN AEROBIC AND ANAEROBIC Blood Culture adequate  volume Performed at [facility], [address], [place], [place] [address]    Culture   Final    NO GROWTH 3 DAYS Performed at [facility], [address], [place], [place] [address]    Report Status PENDING  Incomplete  Culture, Urine     Status: None   Collection Time: 04/07/17  6:24 PM  Result Value Ref Range Status   Specimen Description   Final    URINE, CLEAN CATCH Performed at [facility], [address], [place], [place] [address]    Special Requests   Final    NONE Performed at [facility], [address], [place], [place] [address]    Culture   Final    NO GROWTH Performed at [facility], [address], [place], [place]  [address]    Report Status 04/09/2017 FINAL  Final    Labs: Basic Metabolic Panel: Recent Labs  Lab 04/05/17 2301 04/07/17 0623 04/08/17 0545 04/09/17 0518  NA 139 142 141 139  K 3.5 3.7 3.5 4.4  CL 101 111 110 103  CO2 23 20* 22 26  GLUCOSE 119* 171* 159* 114*  BUN 16 23* 23* 20  CREATININE 0.57 0.65 0.59 0.50  CALCIUM 9.8 9.2 9.3 9.6   CBC: Recent Labs  Lab 04/05/17 2301 04/07/17 0623 04/08/17 0545 04/09/17 0518  WBC 15.4* 11.5* 11.0* 7.8  NEUTROABS 12.4*  --   --   --   HGB 12.6 10.0* 9.8* 11.0*  HCT 39.1 31.4* 30.0* 35.2*  MCV 87.7 88.0 87.5 87.8  PLT 301 290 281 332    SIGNED: Time coordinating discharge: 45 minutes  Debbora Presto, MD  [facility] 04/10/2017, 11:05 AM Pager [phone]  If 7PM-7AM, please contact night-coverage [email or web address] Password [email or web address]

## 2017-04-11 LAB — CULTURE, BLOOD (ROUTINE X 2)
CULTURE: NO GROWTH
CULTURE: NO GROWTH
SPECIAL REQUESTS: ADEQUATE

## 2017-04-24 ENCOUNTER — Emergency Department (HOSPITAL_COMMUNITY)
Admission: EM | Admit: 2017-04-24 | Discharge: 2017-05-23 | Disposition: E | Payer: Medicare Other | Attending: Emergency Medicine | Admitting: Emergency Medicine

## 2017-04-24 DIAGNOSIS — Z79899 Other long term (current) drug therapy: Secondary | ICD-10-CM | POA: Diagnosis not present

## 2017-04-24 DIAGNOSIS — Z87891 Personal history of nicotine dependence: Secondary | ICD-10-CM | POA: Diagnosis not present

## 2017-04-24 DIAGNOSIS — I10 Essential (primary) hypertension: Secondary | ICD-10-CM | POA: Diagnosis not present

## 2017-04-24 DIAGNOSIS — I469 Cardiac arrest, cause unspecified: Secondary | ICD-10-CM | POA: Insufficient documentation

## 2017-04-24 LAB — I-STAT CHEM 8, ED
BUN: 32 mg/dL — AB (ref 6–20)
CREATININE: 1.2 mg/dL — AB (ref 0.44–1.00)
Calcium, Ion: 1.13 mmol/L — ABNORMAL LOW (ref 1.15–1.40)
Chloride: 107 mmol/L (ref 101–111)
GLUCOSE: 162 mg/dL — AB (ref 65–99)
HCT: 19 % — ABNORMAL LOW (ref 36.0–46.0)
Hemoglobin: 6.5 g/dL — CL (ref 12.0–15.0)
POTASSIUM: 4.7 mmol/L (ref 3.5–5.1)
Sodium: 139 mmol/L (ref 135–145)
TCO2: 15 mmol/L — ABNORMAL LOW (ref 22–32)

## 2017-04-24 LAB — I-STAT CG4 LACTIC ACID, ED: Lactic Acid, Venous: 14.07 mmol/L (ref 0.5–1.9)

## 2017-04-24 LAB — I-STAT TROPONIN, ED: Troponin i, poc: 0.22 ng/mL (ref 0.00–0.08)

## 2017-04-24 MED ORDER — ATROPINE SULFATE 1 MG/ML IJ SOLN
INTRAMUSCULAR | Status: AC | PRN
  Administered 2017-04-24: .5 mg via INTRAVENOUS

## 2017-04-24 MED ORDER — CALCIUM CHLORIDE 10 % IV SOLN
INTRAVENOUS | Status: AC | PRN
  Administered 2017-04-24: 1 g via INTRAVENOUS

## 2017-04-24 MED ORDER — EPINEPHRINE PF 1 MG/ML IJ SOLN
0.5000 ug/min | INTRAMUSCULAR | Status: DC
  Filled 2017-04-24: qty 4

## 2017-04-25 MED FILL — Medication: Qty: 1 | Status: AC

## 2017-05-23 NOTE — ED Provider Notes (Signed)
[facility] Provider Note   CSN: [id number] Arrival date & time: 05/01/2017  1404   LEVEL 5 CAVEAT -  CARDIAC ARREST  History   Chief Complaint No chief complaint on file.   HPI Shannon Riggs is a 84 y.o. female.  HPI  84 year old female with a history of emphysema brought in by EMS for cardiac arrest.  The call for unresponsiveness occurred about 1 hour and 5 minutes prior to patient's arrival in the ED.  She has been having on and off CPR with 8 total epinephrine doses and being on an epinephrine drip.  She has regained pulses multiple times but then seems to lose them after the epinephrine boluses wear off.  The original call was for shortness of breath from which family states that she was screaming and yelling about shortness of breath.  She then became unresponsive.  She was initially given a King airway and bagged by Warden/rangerfire department.  This was removed due to abdominal distention and she had a large amount of emesis.  She was intubated by EMS in the field.  At the moment they are bringing her in she has coded again and CPR is being performed.  Past Medical History:  Diagnosis Date  . Blood type, Rh negative 01/23/09  . Cystocele 02/13/09  . GI bleed   . H/O emphysema   . H/O measles 01/23/09  . H/O uterine prolapse 01/23/09  . Hx of varicella 01/23/09  . Hypertension 01/23/09  . Pelvic relaxation 01/23/09  . Thyroid cyst   . Yeast infection     Patient Active Problem List   Diagnosis Date Noted  . Cervical neck pain with evidence of disc disease 04/06/2017  . Generalized weakness 04/06/2017  . Leukocytosis 04/06/2017  . Syncope and collapse 10/25/2012  . Abnormal urinalysis 10/25/2012  . CKD (chronic kidney disease) stage 3, GFR 30-59 ml/min (HCC) 10/25/2012  . Orthostatic hypotension 10/25/2012  . Protein-calorie malnutrition, severe (HCC) 10/25/2012  . GI bleed 10/24/2012  . Acute blood loss anemia 10/24/2012  . AKI (acute  kidney injury) (HCC) 10/24/2012  . HTN (hypertension) 10/24/2012  . Pelvic relaxation 03/16/2010  . Blood type, Rh negative 01/23/2009    Past Surgical History:  Procedure Laterality Date  . ESOPHAGOGASTRODUODENOSCOPY N/A 10/25/2012   Procedure: ESOPHAGOGASTRODUODENOSCOPY (EGD);  Surgeon: Charna ElizabethJyothi Mann, MD;  [facility];  Service: Endoscopy;  Laterality: N/A;    OB History    No data available       Home Medications    Prior to Admission medications   Medication Sig Start Date End Date Taking? Authorizing Provider  amLODipine (NORVASC) 10 MG tablet Take 10 mg by mouth daily. 12/27/14   [provider]  ferrous sulfate 325 (65 FE) MG tablet Take 1 tablet (325 mg total) by mouth daily with breakfast. Patient not taking: Reported on 09/11/2015 10/27/12   Laural BenesJohnson, Clanford L, MD  lidocaine (LIDODERM) 5 % Place 1 patch onto the skin daily. Remove & Discard patch within 12 hours or as directed by MD 04/10/17   Dorothea OgleMyers, Iskra M, MD  lisinopril-hydrochlorothiazide (PRINZIDE,ZESTORETIC) 20-12.5 MG tablet Take 1 tablet by mouth daily. 01/19/15   [provider]  methylPREDNISolone (MEDROL DOSEPAK) 4 MG TBPK tablet Patient will take medrol dose pack that was started in the hospital 04/10/17   Dorothea OgleMyers, Iskra M, MD  polyethylene glycol [place](MIRALAX / Ethelene HalGLYCOLAX) packet Take 17 g by mouth daily as needed for mild constipation. 04/10/17   Danie BinderMyers, Iskra  M, MD  traMADol (ULTRAM) 50 MG tablet Take 1 tablet (50 mg total) by mouth every 6 (six) hours as needed. 04/10/17 04/10/18  Dorothea Ogle, MD    Family History No family history on file.  Social History Social History   Tobacco Use  . Smoking status: Former Smoker    Packs/day: 0.00    Types: Cigarettes    Last attempt to quit: 06/21/1978    Years since quitting: 38.8  . Smokeless tobacco: Never Used  Substance Use Topics  . Alcohol use: No  . Drug use: No     Allergies   Patient has no known allergies.   Review of  Systems Review of Systems  Unable to perform ROS: Patient unresponsive     Physical Exam Updated Vital Signs BP (!) 49/41   Pulse (!) 44   Resp 18   SpO2 90%   Physical Exam  Constitutional: She appears well-developed. She appears cachectic. She is intubated.  HENT:  Head: Normocephalic and atraumatic.  Right Ear: External ear normal.  Left Ear: External ear normal.  Nose: Nose normal.  Eyes: Right eye exhibits no discharge. Left eye exhibits no discharge.  Pupils fixed and dilated  Cardiovascular: Bradycardia present.  Pulmonary/Chest: She is intubated. She has no decreased breath sounds. She has no wheezes. She has no rales.  Abdominal: Soft. She exhibits distension.  Neurological: She is unresponsive.  Skin: Skin is warm and dry.  Nursing note and vitals reviewed.    ED Treatments / Results  Labs (all labs ordered are listed, but only abnormal results are displayed) Labs Reviewed  I-STAT CHEM 8, ED - Abnormal; Notable for the following components:      Result Value   BUN 32 (*)    Creatinine, Ser 1.20 (*)    Glucose, Bld 162 (*)    Calcium, Ion 1.13 (*)    TCO2 15 (*)    Hemoglobin 6.5 (*)    HCT 19.0 (*)    All other components within normal limits  I-STAT CG4 LACTIC ACID, ED - Abnormal; Notable for the following components:   Lactic Acid, Venous 14.07 (*)    All other components within normal limits  I-STAT TROPONIN, ED - Abnormal; Notable for the following components:   Troponin i, poc 0.22 (*)    All other components within normal limits    EKG  EKG Interpretation None       Radiology No results found.  Procedures Procedures (including critical care time)  Cardiopulmonary Resuscitation (CPR) Procedure Note Directed/Performed by: Audree Camel I personally directed ancillary staff and/or performed CPR in an effort to regain return of spontaneous circulation and to maintain cardiac, neuro and systemic perfusion.      EMERGENCY DEPARTMENT  [place] CARDIAC EXAM "Study: Limited Ultrasound of the Heart and Pericardium"  INDICATIONS:Cardiac arrest Multiple views of the heart and pericardium were obtained in real-time with a multi-frequency probe.  PERFORMED UE:AVWUJW IMAGES ARCHIVED?: No LIMITATIONS:  Emergent procedure VIEWS USED: Subcostal 4 chamber INTERPRETATION: Cardiac activity present, Pericardial effusioin absent and Decreased contractility   Medications Ordered in ED Medications  EPINEPHrine (ADRENALIN) 4 mg in dextrose 5 % 250 mL (0.016 mg/mL) infusion (not administered)  calcium chloride injection (1 g Intravenous Given 25-Apr-2017 1410)  atropine injection (0.5 mg Intravenous Given 04-25-2017 1408)     Initial Impression / Assessment and Plan / ED Course  I have reviewed the triage vital signs and the nursing notes.  Pertinent labs & imaging results  that were available during my care of the patient were reviewed by me and considered in my medical decision making (see chart for details).     Patient presents after CPR. CPR stopped on arrival, has pulses. Then started to brady down, given atropine. Coded again, labs sent. Briefly started CPR again. I talked to POA, grandson, and he understands the situation and asks for code to be stopped. This is the best course of action for her. She is unlikely to have any good neuro outcome if she were to maintain pulses. Shortly after discussion, she re-coded and expired. Family does not want an autopsy. Questions answered to best of my ability. Likely combination of COPD vs acute anemia. Even if she were to get a blood transfusion emergently I think it is unlikely she would survive and/or survive with good neuro outcome.  Final Clinical Impressions(s) / ED Diagnoses   Final diagnoses:  Cardiac arrest [facility])    ED Discharge Orders    None       Pricilla Loveless, MD May 08, 2017 1513

## 2017-05-23 NOTE — ED Notes (Signed)
I Stat Trop I results of 0.22 reported to dr. Criss AlvineGoldston

## 2017-05-23 NOTE — ED Notes (Signed)
I Stat Chem 8 results of Hg 6.5. I Stat Lactic of 14.07 reported to Dr. Criss AlvineGoldston

## 2017-05-23 NOTE — Progress Notes (Signed)
Pt arrived via EMS being bagged and CPR in progress.  Pt bagged initially upon arrival.  Pt did have pulses return and pt was placed on the vent.  Pt lost pulses again and CPR restarted and meds given.  Family did not want to continue if pt coded again.  Time of death was called at 1425.  Vent removed.  RT will continue to monitor.

## 2017-05-23 NOTE — Code Documentation (Signed)
Patient time of death occurred at 1425 

## 2017-05-23 NOTE — Progress Notes (Signed)
Called to ED for patient who passed away.  Met with family for prayer and guided them to visit with deceased when staff had her ready.  Had prayer with the family. Gathered some info to help nurse such as son's address and phone number.  S , Chaplain   05/22/2017 1500  Clinical Encounter Type  Visited With Patient;Family  Visit Type Spiritual support;Death;ED  Referral From Nurse  Consult/Referral To Chaplain  Spiritual Encounters  Spiritual Needs Prayer;Emotional;Grief support  Stress Factors  Family Stress Factors Loss   

## 2017-05-23 NOTE — Code Documentation (Signed)
Pt brought to ER today via [facility] as cardiac arrest - patient family activated EMS for unresponsiveness on the couch this afternoon, stated patient had been in respiratory distress this morning and family was going to bring here via private vehicle so they went to crank the car, when they came back patient was unresponsive. On EMS arrival, patient was pulseless, CPR was initiated, ROSC was obtained after 23 minutes and a few epi's, patient per EMS would maintain a pulse for just a few minutes then lose it again. Received a total of 8 epis in route and was down a total amount of 1 hour, 35 minutes of that was pulseless. On arrival, CPR was in progress, 7.0 ETT at 24 @ teeth, CBG 164.

## 2017-05-23 DEATH — deceased

## 2018-10-13 IMAGING — CT CT NECK W/ CM
4 of 5 series · 11 of 27 positions shown, 13 images · IV contrast (agent unspecified)
Comparison: CT HEAD March 08, 2017

CLINICAL DATA: Head and neck pain. Leukocytosis. History of
hypertension, thyroid cyst.

EXAM:
CT HEAD WITHOUT CONTRAST
CT NECK WITH CONTRAST
TECHNIQUE: Contiguous axial images were obtained from the base of the skull
through the vertex without intravenous contrast. Multidetector CT
imaging of the and neck was performed using the standard protocol
following the bolus administration of intravenous contrast.
CONTRAST:  75 cc Esovue-9MM

[Series 2: axial neck · axial · 0.39mm/px · z∈[-243,-183]mm · 2 of 90 slices shown, 3 images]
[im 30/90  soft-tissue]
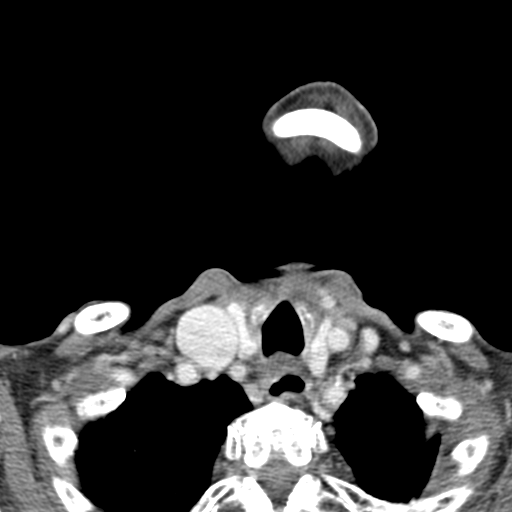
[im 30/90  bone]
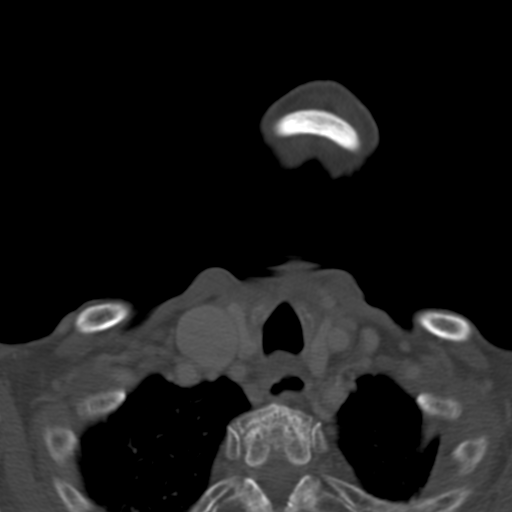
[im 60/90  bone]
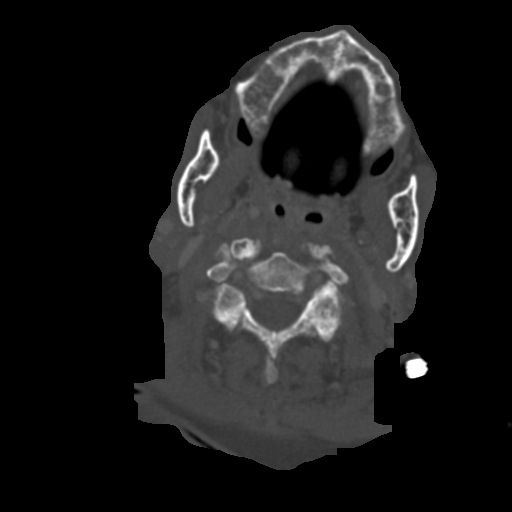

[Series 4: orthogonal ax · axial · 0.29mm/px · z∈[-280,-223]mm · 2 of 105 slices shown]
[im 35/105  bone]
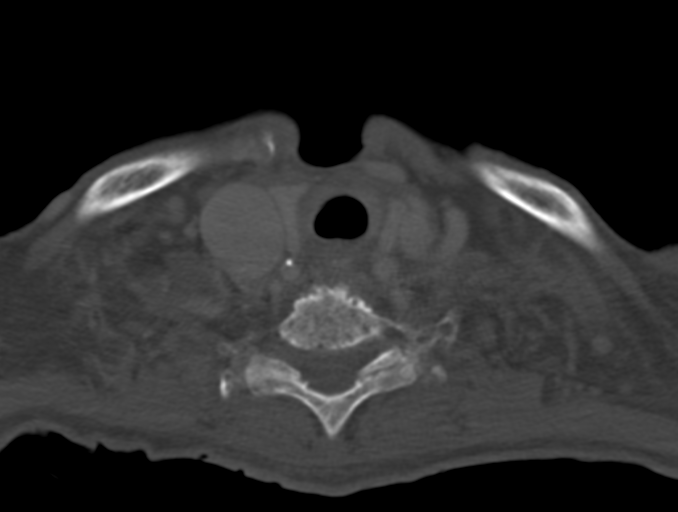
[im 70/105  bone]
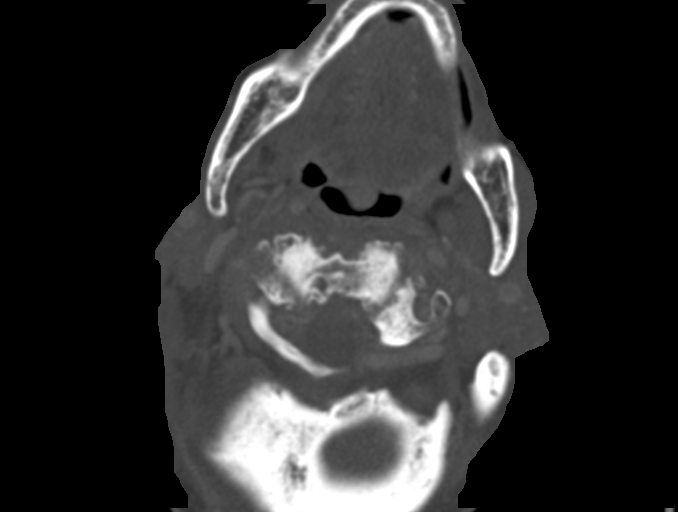

[Series 6: sag neck · sagittal · 0.30mm/px · 5 of 101 slices shown, 6 images]
[im 34/101  bone]
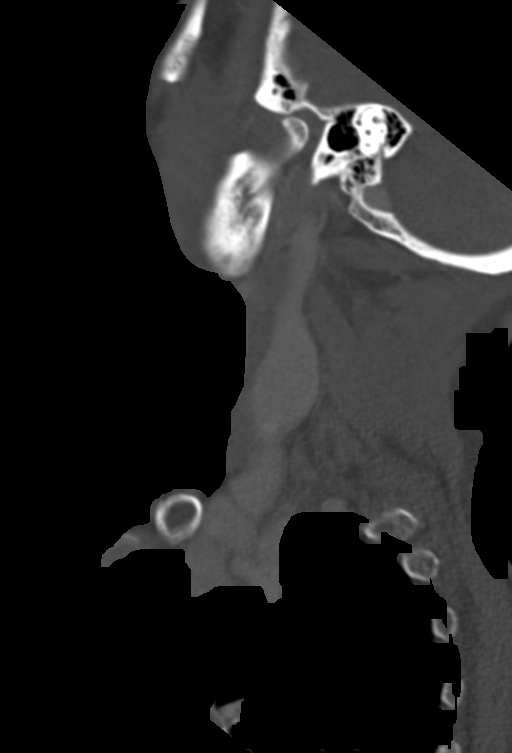
[im 42/101  bone]
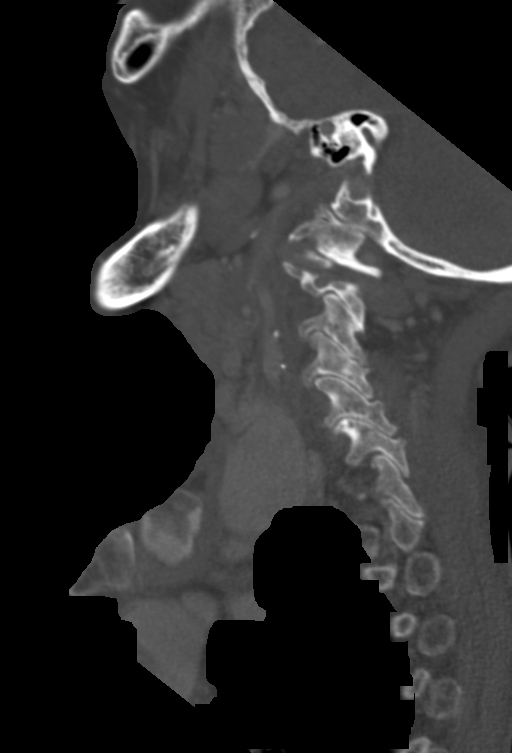
[im 51/101  soft-tissue]
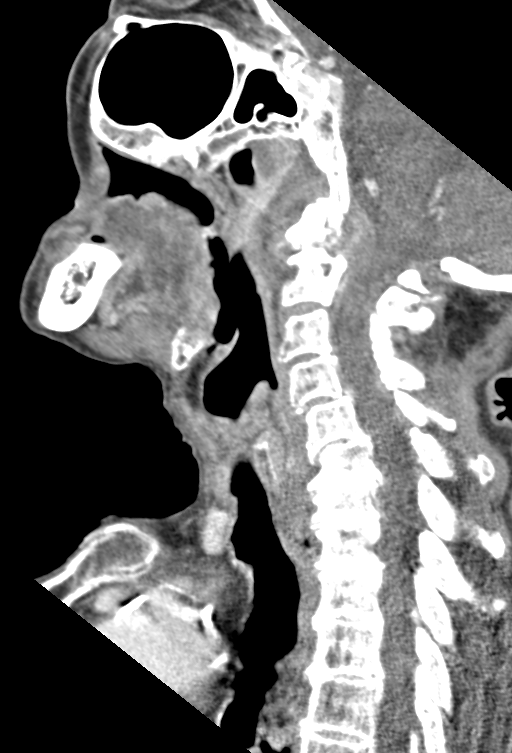
[im 51/101  bone]
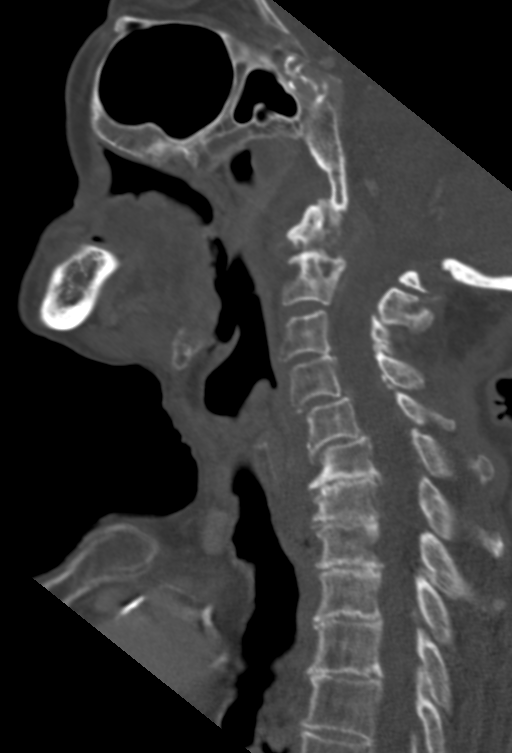
[im 59/101  bone]
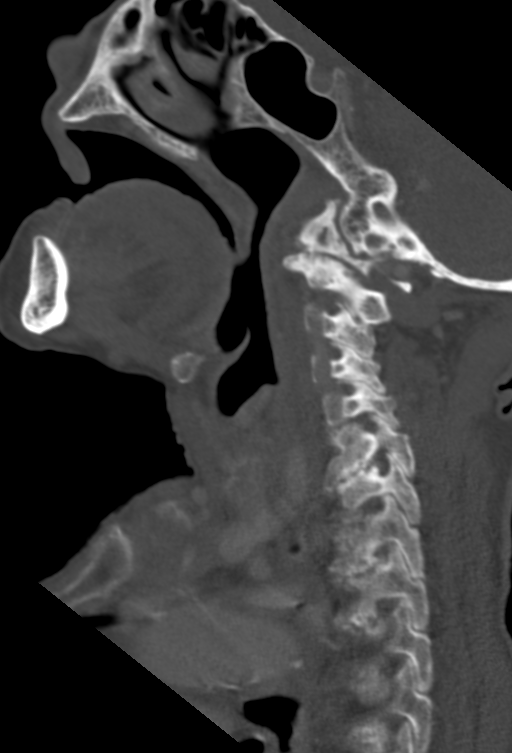
[im 67/101  bone]
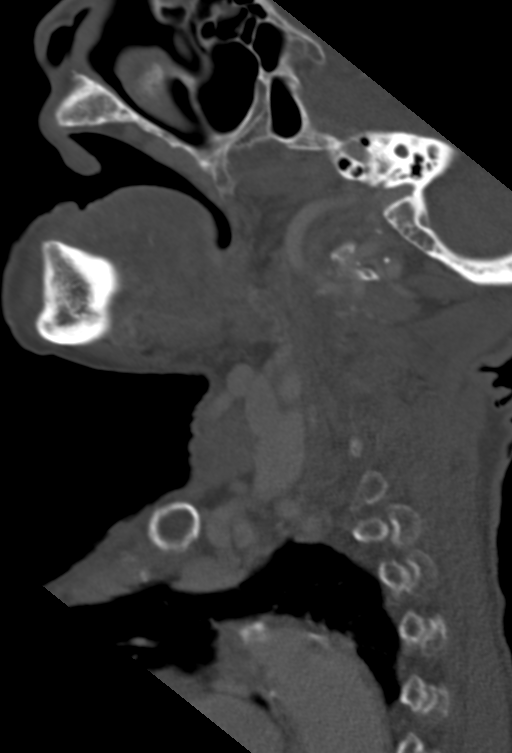

[Series 9: orthogonal bone · axial · 0.23mm/px · z∈[-276,-223]mm · 2 of 99 slices shown]
[im 33/99  bone]
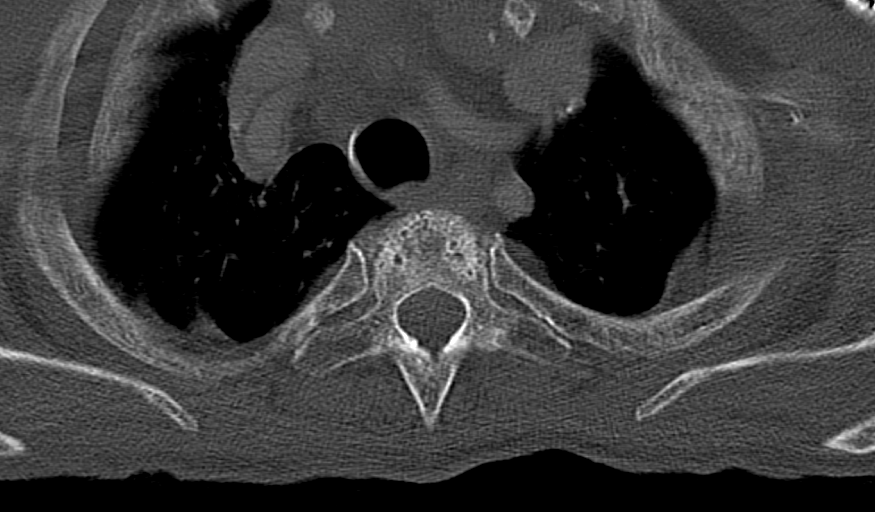
[im 66/99  bone]
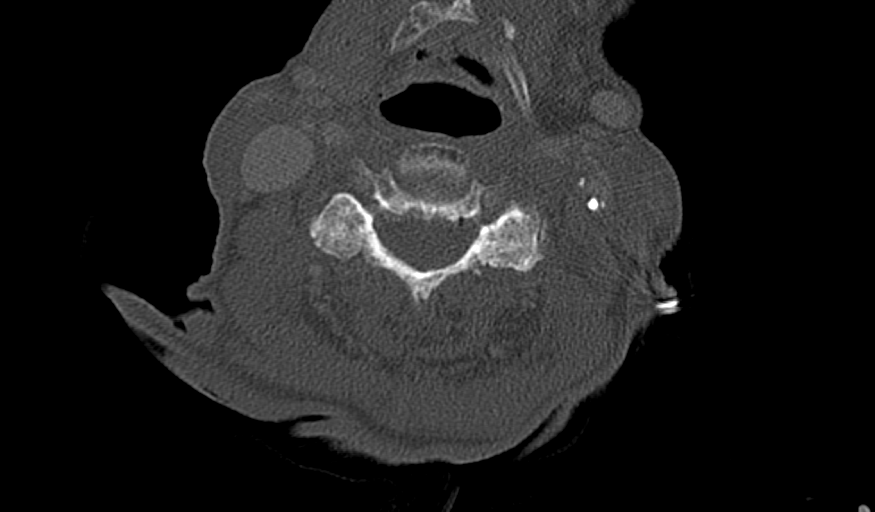

[11 of 27 positions shown; findings below may reference images not displayed]

FINDINGS: CT HEAD FINDINGS

BRAIN: No intraparenchymal hemorrhage, mass effect nor midline
shift. The ventricles and sulci are normal for age. Patchy
supratentorial white matter hypodensities within normal range for
patient's age, though non-specific are most compatible with chronic
small vessel ischemic disease. Small area RIGHT inferior frontal
lobe encephalomalacia. No acute large vascular territory infarcts.
No abnormal extra-axial fluid collections. Basal cisterns are
patent.

VASCULAR: Severe calcific atherosclerosis of the carotid siphons.

SKULL: No skull fracture. No significant scalp soft tissue swelling.

SINUSES/ORBITS: Scattered mucosal retention cyst. Included ocular
globes and orbital contents are non-suspicious. Old LEFT medial
orbital blowout fracture. Status post bilateral ocular lens
implants.

OTHER: None.

CT NECK FINDINGS

PHARYNX AND LARYNX: Normal.  Widely patent airway.

SALIVARY GLANDS: Severely atrophic LEFT submandibular gland with 5
mm LEFT submandibular sialolith, 3 mm sialolith LEFT submandibular
duct.

THYROID: 8 mm LEFT thyroid nodule, below size follow-up evaluation,
otherwise unremarkable.

LYMPH NODES: No lymphadenopathy by CT size criteria.

VASCULAR: Mild calcific atherosclerosis aortic arch. Moderate
calcific atherosclerosis bilateral carotid bifurcations, not
tailored for assessment of potentially hemodynamically significant
stenosis.

SKELETON: Grade 1 C4-5 and C5-6 anterolisthesis on degenerative
basis. Severe atlantodental osteoarthrosis with subchondral cysts
and mild basilar invagination. Associated large amount of pannus.
Multilevel severe degenerative discs. No destructive bony lesions.
Severe C3-4 through C6-7 neural foraminal narrowing.

UPPER CHEST: Severe centrilobular emphysema. No mediastinal
lymphadenopathy and included chest.

OTHER: None.
IMPRESSION: CT HEAD:

1. No acute intracranial process.
2. Moderate chronic small vessel ischemic disease. RIGHT frontal
encephalomalacia is likely posttraumatic.

CT NECK:

1. No acute process in the neck.
2. LEFT submandibular sialolith with severe chronic sialadenosis.
3. Severe C1-2 osteoarthrosis associated with large volume pannus
and mild basilar invagination associated with inflammatory
arthropathy/CPPD.
4. Grade 1 C4-5 and C5-6 anterolisthesis on degenerative basis. No
fracture.
5. Severe C3-4 through C6-7 neural foraminal narrowing.

Aortic Atherosclerosis ([id number]) and Emphysema ([id number].O).
